# Patient Record
Sex: Male | Born: 1995
Health system: Southern US, Community
[De-identification: ages and names within clinical notes are randomized; demographics above are authoritative.]

## PROBLEM LIST (undated history)

## (undated) DIAGNOSIS — I10 Essential (primary) hypertension: Secondary | ICD-10-CM

## (undated) HISTORY — DX: Essential (primary) hypertension: I10

---
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
## 2014-01-15 ENCOUNTER — Other Ambulatory Visit: Payer: Self-pay | Admitting: Family Medicine

## 2014-03-29 ENCOUNTER — Other Ambulatory Visit: Payer: Self-pay | Admitting: Family Medicine

## 2014-06-09 ENCOUNTER — Other Ambulatory Visit: Payer: Self-pay | Admitting: Family Medicine

## 2015-03-16 ENCOUNTER — Ambulatory Visit (INDEPENDENT_AMBULATORY_CARE_PROVIDER_SITE_OTHER): Payer: BLUE CROSS/BLUE SHIELD | Admitting: Family Medicine

## 2015-03-16 ENCOUNTER — Encounter: Payer: Self-pay | Admitting: Family Medicine

## 2015-03-16 VITALS — BP 132/80 | HR 55 | Temp 97.7°F | Wt 196.0 lb

## 2015-03-16 DIAGNOSIS — I10 Essential (primary) hypertension: Secondary | ICD-10-CM

## 2015-03-16 LAB — COMPLETE METABOLIC PANEL WITH GFR
ALBUMIN: 4.2 g/dL (ref 3.6–5.1)
ALT: 22 U/L (ref 9–46)
AST: 19 U/L (ref 10–40)
Alkaline Phosphatase: 75 U/L (ref 40–115)
BUN: 16 mg/dL (ref 7–25)
CO2: 25 mmol/L (ref 20–31)
CREATININE: 0.81 mg/dL (ref 0.60–1.35)
Calcium: 9.5 mg/dL (ref 8.6–10.3)
Chloride: 106 mmol/L (ref 98–110)
Glucose, Bld: 105 mg/dL — ABNORMAL HIGH (ref 65–99)
Potassium: 4.1 mmol/L (ref 3.5–5.3)
Sodium: 141 mmol/L (ref 135–146)
Total Bilirubin: 0.5 mg/dL (ref 0.2–1.2)
Total Protein: 6.7 g/dL (ref 6.1–8.1)

## 2015-03-16 MED ORDER — ATENOLOL 50 MG PO TABS: ORAL_TABLET | ORAL | Status: DC

## 2015-03-16 NOTE — Progress Notes (Signed)
   Subjective:    Patient ID: Timothy Shea, male    DOB: May 05, 1996, 19 y.o.   MRN: [id number]  HPI Hypertension- Pt denies chest pain, SOB, dizziness, or heart palpitations.  Taking meds as directed w/o problems.  Denies medication side effects.   No swelling.    Review of Systems     Objective:   Physical Exam  Constitutional: He is oriented to person, place, and time. He appears well-developed and well-nourished.  HENT:  Head: Normocephalic and atraumatic.  Cardiovascular: Normal rate, regular rhythm and normal heart sounds.   Pulmonary/Chest: Effort normal and breath sounds normal.  Neurological: He is alert and oriented to person, place, and time.  Skin: Skin is warm and dry.  Psychiatric: He has a normal mood and affect. His behavior is normal.          Assessment & Plan:  HTN- well controlled. F/U in 6 months.  Due for CMP.

## 2015-03-19 NOTE — Progress Notes (Signed)
Quick Note:  All labs are normal. ______ 

## 2015-03-26 ENCOUNTER — Encounter: Payer: Self-pay | Admitting: *Deleted

## 2015-03-29 ENCOUNTER — Ambulatory Visit (INDEPENDENT_AMBULATORY_CARE_PROVIDER_SITE_OTHER): Payer: BLUE CROSS/BLUE SHIELD

## 2015-03-29 ENCOUNTER — Encounter: Payer: Self-pay | Admitting: Family Medicine

## 2015-03-29 ENCOUNTER — Ambulatory Visit (INDEPENDENT_AMBULATORY_CARE_PROVIDER_SITE_OTHER): Payer: BLUE CROSS/BLUE SHIELD | Admitting: Family Medicine

## 2015-03-29 VITALS — BP 132/78 | HR 55 | Ht 74.0 in | Wt 199.0 lb

## 2015-03-29 DIAGNOSIS — R0602 Shortness of breath: Secondary | ICD-10-CM | POA: Diagnosis not present

## 2015-03-29 DIAGNOSIS — R001 Bradycardia, unspecified: Secondary | ICD-10-CM

## 2015-03-29 NOTE — Progress Notes (Signed)
Subjective:    Patient ID: Timothy Shea, male    DOB: [birth year], 19 y.o.   MRN: [id number]  HPI For one month feels like can't get a full breath. No chest pain, no palpitations. No wheezing. No prior history of pulmonary disease such as asthma. He denies any prior history of allergies. No recent upper respiratory infections they think might have triggered this. He denies any GI symptoms such as nausea vomiting or diarrhea. Denies any fevers chills or sweats. He said when this first started about a month ago it was only happening about once a week but over the last 4 days it has been having multiple times a day. He denies any postnasal drip or sinus symptoms or swelling in the mouth or tongue. He denies any difficulty swallowing. He did have one day where he was lying on his back working underneath a car when he started to feel the tightness and actually started to feel lightheaded. Otherwise he denies any additional symptoms.   Review of Systems No blood in the urine or stool. No dysphagia.  BP 132/78 mmHg  Pulse 55  Ht 6\' 2"  (1.88 m)  Wt 199 lb (90.266 kg)  BMI 25.54 kg/m2  SpO2 98%  PF 590 L/min    No Known Allergies  Past Medical History  Diagnosis Date  . Essential hypertension 08/31/2012    History reviewed. No pertinent past surgical history.  Social History   Social History  . Marital Status: Single    Spouse Name: N/A  . Number of Children: N/A  . Years of Education: N/A   Occupational History  . Not on file.   Social History Main Topics  . Smoking status: Never Smoker   . Smokeless tobacco: Not on file  . Alcohol Use: Not on file  . Drug Use: Not on file  . Sexual Activity: Not on file   Other Topics Concern  . Not on file   Social History Narrative    Family History  Problem Relation Age of Onset  . Hypertension Mother   . Depression Mother   . Hypothyroidism Mother     Outpatient Encounter Prescriptions as of 03/29/2015  Medication Sig  .  atenolol (TENORMIN) 50 MG tablet TAKE 1 TABLET DAILY  . cyclobenzaprine (FLEXERIL) 10 MG tablet Take a full tab every 8-12 hours only as needed for neck pain or headache, may cause sedation.   No facility-administered encounter medications on file as of 03/29/2015.          Objective:   Physical Exam  Constitutional: He is oriented to person, place, and time. He appears well-developed and well-nourished.  HENT:  Head: Normocephalic and atraumatic.  Right Ear: External ear normal.  Left Ear: External ear normal.  Nose: Nose normal.  Mouth/Throat: Oropharynx is clear and moist.  TMs and canals are clear.   Eyes: Conjunctivae and EOM are normal. Pupils are equal, round, and reactive to light.  Neck: Neck supple. No thyromegaly present.  Cardiovascular: Normal rate and normal heart sounds.   Pulmonary/Chest: Effort normal and breath sounds normal.  Lymphadenopathy:    He has no cervical adenopathy.  Neurological: He is alert and oriented to person, place, and time.  Skin: Skin is warm and dry.  Psychiatric: He has a normal mood and affect.          Assessment & Plan:  Shortness of breath-we'll do peak flows today. Pulse ox is normal and reassuring. We'll also get chest x-ray today. No  prior history of pulmonary disease and no other symptoms consistent with a viral illness. Consider pneumothorax. Peak flows in the green zone today which is reassuring as well as a normal pulse oximetry reading. Will check CBC as well to rule out anemia.  Bradycardia-most likely secondary to his atenolol. In someone this young a would not suspect that a pulse of 55 would cause any type of shortness of breath. But if pulmonary workup is negative then consider further evaluation for cardiac workup.

## 2015-03-30 LAB — CBC WITH DIFFERENTIAL/PLATELET
Basophils Absolute: 0.1 10*3/uL (ref 0.0–0.1)
Basophils Relative: 1 % (ref 0–1)
Eosinophils Absolute: 0.2 10*3/uL (ref 0.0–0.7)
Eosinophils Relative: 3 % (ref 0–5)
HCT: 43.8 % (ref 39.0–52.0)
HEMOGLOBIN: 14.8 g/dL (ref 13.0–17.0)
LYMPHS PCT: 29 % (ref 12–46)
Lymphs Abs: 1.7 10*3/uL (ref 0.7–4.0)
MCH: 28.7 pg (ref 26.0–34.0)
MCHC: 33.8 g/dL (ref 30.0–36.0)
MCV: 85 fL (ref 78.0–100.0)
MONO ABS: 0.5 10*3/uL (ref 0.1–1.0)
MONOS PCT: 9 % (ref 3–12)
MPV: 9.7 fL (ref 8.6–12.4)
NEUTROS ABS: 3.5 10*3/uL (ref 1.7–7.7)
Neutrophils Relative %: 58 % (ref 43–77)
Platelets: 229 10*3/uL (ref 150–400)
RBC: 5.15 MIL/uL (ref 4.22–5.81)
RDW: 13.5 % (ref 11.5–15.5)
WBC: 6 10*3/uL (ref 4.0–10.5)

## 2015-09-04 ENCOUNTER — Other Ambulatory Visit: Payer: Self-pay | Admitting: Family Medicine

## 2015-12-15 ENCOUNTER — Encounter: Payer: Self-pay | Admitting: Emergency Medicine

## 2015-12-15 ENCOUNTER — Emergency Department (INDEPENDENT_AMBULATORY_CARE_PROVIDER_SITE_OTHER)
Admission: EM | Admit: 2015-12-15 | Discharge: 2015-12-15 | Disposition: A | Discharge status: Home / Self Care | Payer: BLUE CROSS/BLUE SHIELD | Source: Home / Self Care | Attending: Emergency Medicine | Admitting: Emergency Medicine

## 2015-12-15 DIAGNOSIS — I10 Essential (primary) hypertension: Secondary | ICD-10-CM | POA: Diagnosis not present

## 2015-12-15 MED ORDER — ATENOLOL 50 MG PO TABS: 50.0000 mg | ORAL_TABLET | Freq: Every day | ORAL | Status: DC

## 2015-12-15 NOTE — ED Provider Notes (Signed)
CSN: [id number]     Arrival date & time 12/15/15  1527 History   First MD Initiated Contact with Patient 12/15/15 1533     Chief Complaint  Patient presents with  . Hypertension  [facility], Saturday, 12/15/2015  HPI Patient states that he has hypertension, takes atenolol 50 mg daily for 2 years and that works well to control BP without side effects. 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 He requests 30 day refill of atenolol until he can get in to see his PCP within the next month to reevaluate BP and refill BP med.  He checked his BP at home today, was 170/92. He denies headaches, change in vision, chest pain, shortness of breath, palpitations, syncope, edema, depressed mood. Past Medical History  Diagnosis Date  . Essential hypertension 08/31/2012   History reviewed. No pertinent past surgical history. Family History  Problem Relation Age of Onset  . Hypertension Mother   . Depression Mother   . Hypothyroidism Mother    Social History  Substance Use Topics  . Smoking status: Never Smoker   . Smokeless tobacco: None  . Alcohol Use: Yes     Comment: 1 drink per week    Review of Systems  All other systems reviewed and are negative.   Allergies  Review of patient's allergies indicates no known allergies.  Home Medications   Prior to Admission medications   Medication Sig Start Date End Date Taking? Authorizing Provider  atenolol (TENORMIN) 50 MG tablet Take 1 tablet (50 mg total) by mouth daily. APPOINTMENT NEEDED FOR FURTHER REFILLS 09/04/15   Agapito Gamesatherine D Metheney, MD  atenolol (TENORMIN) 50 MG tablet Take 1 tablet (50 mg total) by mouth daily. Any further refills would need to be authorized by Dr. Linford ArnoldMetheney. 12/15/15 01/15/16  Lajean Manesavid Massey, MD   Meds Ordered and Administered this Visit  Medications - No data to display  BP 158/96 mmHg  Pulse 74  Temp(Src) 98.4 F (36.9 C) (Oral)   Resp 16  Ht 6\' 2"  (1.88 m)  Wt 197 lb 12 oz (89.699 kg)  BMI 25.38 kg/m2  SpO2 100% No data found.   Physical Exam  Constitutional: No distress.  Cardiovascular: Normal rate.   Pulmonary/Chest: Effort normal.  Abdominal: He exhibits no distension.   reviewed vital signs, BP 158/96. He is alert, cooperative, no distress ED Course  Procedures (including critical care time)  Labs Review Labs Reviewed - No data to display  Imaging Review No results found.    MDM   1. Essential hypertension    Discharge Medication List as of 12/15/2015  4:23 PM       Details   atenolol (TENORMIN) 50 MG tablet Take 1 tablet (50 mg total) by mouth daily. Any further refills would need to be authorized by Dr. Linford ArnoldMetheney., Starting 12/15/2015, Until Tue 01/15/16, Print        Precautions and other advice discussed. Over 20 minutes spent, greater than 50% of the time spent for counseling and coordination of care. Urged him to make an appointment to follow up with Dr. Linford ArnoldMetheney, his PCP within 30 days. He voiced understanding.     Lajean Manesavid Massey, MD 12/15/15 [phone]

## 2015-12-15 NOTE — ED Notes (Signed)
Patient states HTN and out of medication, in college and has been unable to make appointment with PCP

## 2016-01-18 ENCOUNTER — Encounter: Payer: Self-pay | Admitting: Family Medicine

## 2016-01-18 ENCOUNTER — Ambulatory Visit (INDEPENDENT_AMBULATORY_CARE_PROVIDER_SITE_OTHER): Payer: BLUE CROSS/BLUE SHIELD | Admitting: Family Medicine

## 2016-01-18 DIAGNOSIS — I10 Essential (primary) hypertension: Secondary | ICD-10-CM

## 2016-01-18 DIAGNOSIS — M62838 Other muscle spasm: Secondary | ICD-10-CM | POA: Insufficient documentation

## 2016-01-18 DIAGNOSIS — Z23 Encounter for immunization: Secondary | ICD-10-CM | POA: Diagnosis not present

## 2016-01-18 DIAGNOSIS — M6248 Contracture of muscle, other site: Secondary | ICD-10-CM | POA: Diagnosis not present

## 2016-01-18 DIAGNOSIS — G44209 Tension-type headache, unspecified, not intractable: Secondary | ICD-10-CM

## 2016-01-18 LAB — COMPLETE METABOLIC PANEL WITH GFR
ALT: 24 U/L (ref 9–46)
AST: 20 U/L (ref 10–40)
Albumin: 4.3 g/dL (ref 3.6–5.1)
Alkaline Phosphatase: 73 U/L (ref 40–115)
BILIRUBIN TOTAL: 0.6 mg/dL (ref 0.2–1.2)
BUN: 11 mg/dL (ref 7–25)
CALCIUM: 9.4 mg/dL (ref 8.6–10.3)
CHLORIDE: 104 mmol/L (ref 98–110)
CO2: 27 mmol/L (ref 20–31)
Creat: 0.88 mg/dL (ref 0.60–1.35)
Glucose, Bld: 82 mg/dL (ref 65–99)
Potassium: 4.2 mmol/L (ref 3.5–5.3)
Sodium: 140 mmol/L (ref 135–146)
TOTAL PROTEIN: 6.9 g/dL (ref 6.1–8.1)

## 2016-01-18 LAB — LIPID PANEL
CHOL/HDL RATIO: 4.7 ratio (ref <=5.0)
Cholesterol: 194 mg/dL (ref 125–200)
HDL: 41 mg/dL (ref >=40)
LDL Cholesterol: 105 mg/dL (ref <130)
Triglycerides: 240 mg/dL — ABNORMAL HIGH (ref <150)
VLDL: 48 mg/dL — ABNORMAL HIGH (ref <30)

## 2016-01-18 MED ORDER — CYCLOBENZAPRINE HCL 10 MG PO TABS: ORAL_TABLET | ORAL | Status: DC

## 2016-01-18 MED ORDER — ATENOLOL 50 MG PO TABS: 50.0000 mg | ORAL_TABLET | Freq: Every day | ORAL | Status: DC

## 2016-01-18 NOTE — Progress Notes (Signed)
   Subjective:    Patient ID: Timothy Shea, male    DOB: 02/25/1996, 20 y.o.   MRN: [id number]  HPI Hypertension- Pt denies chest pain, SOB, dizziness, or heart palpitations.  Taking meds as directed w/o problems.  Denies medication side effects.  No swelling. No side effects.  Did also like a refill on the muscle relaxer that we had given him previously for neck pain and headaches. He doesn't use it often but it does seem to work well. Refill provided.  Review of Systems     Objective:   Physical Exam  Constitutional: He is oriented to person, place, and time. He appears well-developed and well-nourished.  HENT:  Head: Normocephalic and atraumatic.  Cardiovascular: Normal rate, regular rhythm and normal heart sounds.   Pulmonary/Chest: Effort normal and breath sounds normal.  Neurological: He is alert and oriented to person, place, and time.  Skin: Skin is warm and dry.  Psychiatric: He has a normal mood and affect. His behavior is normal.          Assessment & Plan:  HTN - well controlled. Continue current regimen. Refills provided. Did strongly encourage and follow-up in 6 months. Explained that for hypertension this is a condition that we do follow twice a year. He is also due for updated blood work that lab slip was provided today.  Neck pain/headaches-refilled Flexeril for 20 tabs today.  Tdap given today.

## 2016-12-02 ENCOUNTER — Encounter: Payer: Self-pay | Admitting: Family Medicine

## 2016-12-02 ENCOUNTER — Ambulatory Visit (INDEPENDENT_AMBULATORY_CARE_PROVIDER_SITE_OTHER): Payer: BLUE CROSS/BLUE SHIELD | Admitting: Family Medicine

## 2016-12-02 VITALS — BP 132/84 | HR 66 | Ht 74.0 in | Wt 211.0 lb

## 2016-12-02 DIAGNOSIS — F329 Major depressive disorder, single episode, unspecified: Secondary | ICD-10-CM | POA: Diagnosis not present

## 2016-12-02 DIAGNOSIS — G44209 Tension-type headache, unspecified, not intractable: Secondary | ICD-10-CM | POA: Diagnosis not present

## 2016-12-02 DIAGNOSIS — I10 Essential (primary) hypertension: Secondary | ICD-10-CM

## 2016-12-02 MED ORDER — ATENOLOL 50 MG PO TABS: 50.0000 mg | ORAL_TABLET | Freq: Every day | ORAL | 2 refills | Status: DC

## 2016-12-02 MED ORDER — CYCLOBENZAPRINE HCL 10 MG PO TABS: ORAL_TABLET | ORAL | 0 refills | Status: DC

## 2016-12-02 NOTE — Progress Notes (Addendum)
Subjective:    CC: HTN  HPI:  Hypertension- Pt denies chest pain, SOB, dizziness, or heart palpitations.  Taking meds as directed w/o problems.  Denies medication side effects. Does check his blood pressure at home and reports getting 130s over 70s at home when he is on his medication.   He also reports that he started not doing well in school. He was recently diagnosed with major depressive disorder and is currently being treated with medication through his school. He can remove the name of the medication but says he will try to get the name of it to me. His only taking 1 class this semester.  Past medical history, Surgical history, Family history not pertinant except as noted below, Social history, Allergies, and medications have been entered into the medical record, reviewed, and corrections made.   Review of Systems: No fevers, chills, night sweats, weight loss, chest pain, or shortness of breath.   Objective:    General: Well Developed, well nourished, and in no acute distress.  Neuro: Alert and oriented x3, extra-ocular muscles intact, sensation grossly intact.  HEENT: Normocephalic, atraumatic  Skin: Warm and dry, no rashes. Cardiac: Regular rate and rhythm, no murmurs rubs or gallops, no lower extremity edema.  Respiratory: Clear to auscultation bilaterally. Not using accessory muscles, speaking in full sentences.   Impression and Recommendations:    HTN - Uncontrolled, that he did not take his medication today he ran out. We'll go ahead and refill today and asked him to come back for nurse visit in 1-2 weeks back on the medication.  Major depressive disorder-currently under management with psychiatry through his school.  PHQ - 9 score of 12 today.

## 2016-12-02 NOTE — Addendum Note (Signed)
Addended by: Deno Etienne on: 12/02/2016 11:37 AM   Modules accepted: Orders

## 2016-12-08 ENCOUNTER — Ambulatory Visit: Payer: BLUE CROSS/BLUE SHIELD

## 2016-12-26 ENCOUNTER — Ambulatory Visit: Payer: BLUE CROSS/BLUE SHIELD

## 2017-03-16 ENCOUNTER — Encounter: Payer: Self-pay | Admitting: Osteopathic Medicine

## 2017-03-16 ENCOUNTER — Ambulatory Visit (INDEPENDENT_AMBULATORY_CARE_PROVIDER_SITE_OTHER): Payer: BLUE CROSS/BLUE SHIELD | Admitting: Osteopathic Medicine

## 2017-03-16 VITALS — BP 111/72 | HR 60 | Resp 18 | Wt 226.2 lb

## 2017-03-16 DIAGNOSIS — I10 Essential (primary) hypertension: Secondary | ICD-10-CM | POA: Diagnosis not present

## 2017-03-16 DIAGNOSIS — F329 Major depressive disorder, single episode, unspecified: Secondary | ICD-10-CM

## 2017-03-16 MED ORDER — ATENOLOL 50 MG PO TABS: 50.0000 mg | ORAL_TABLET | Freq: Every day | ORAL | 1 refills | Status: DC

## 2017-03-16 NOTE — Progress Notes (Signed)
HPI: Timothy Shea is a 21 y.o. male  who presents to [facility] today, 03/16/17,  for chief complaint of:  Chief Complaint  Patient presents with  . Hypertension  . Depression    Last seen by PCP 12/02/2016, notes reviewed. Following up at that time for hypertension. 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 At that time, patient was unable to remember the name of the medication he was taking. On review of records from external pharmacy, looks like he is on venlafaxine ER 37.5 mg last dispensed 02/19/2017.  Stop Venlaxafine 6 days ago due to concerns that this was increasing his blood pressure. He is here today to get his blood pressure rechecked and refill on his atenolol.  He has an appointment with his psychiatrist in 3 days. He is not interested in changing or starting new medications at this point. He has experienced no concerning withdrawal side effects after stopping venlaxafine.   Past medical history, surgical history, social history and family history reviewed.  Patient Active Problem List   Diagnosis Date Noted  . Neck muscle spasm 01/18/2016  . Essential hypertension 08/31/2012  . HYPERTRIGLYCERIDEMIA 08/06/2010  . DYSHIDROTIC ECZEMA, HANDS 04/27/2007    Current medication list and allergy/intolerance information reviewed.   Current Outpatient Prescriptions on File Prior to Visit  Medication Sig Dispense Refill  . atenolol (TENORMIN) 50 MG tablet Take 1 tablet (50 mg total) by mouth daily. 90 tablet 2  . cyclobenzaprine (FLEXERIL) 10 MG tablet Take a full tab every 8-12 hours only as needed for neck pain or headache, may cause sedation. 45 tablet 0   No current facility-administered medications on file prior to visit.    No Known Allergies    Review of Systems:  Constitutional: No recent illness  HEENT: No  headache, no vision change  Cardiac: No  chest  pain, No  pressure, No palpitations  Respiratory:  No  shortness of breath. No  Cough  Neurologic: No  weakness, No  Dizziness  Psychiatric: No  concerns with depression, No  concerns with anxiety - GAD7/PHQ9 as below   Exam:  BP 111/72   Pulse 60   Resp 18   Wt 226 lb 3.2 oz (102.6 kg)   BMI 29.04 kg/m   Constitutional: VS see above. General Appearance: alert, well-developed, well-nourished, NAD  Eyes: Normal lids and conjunctive, non-icteric sclera  Ears, Nose, Mouth, Throat: MMM, Normal external inspection ears/nares/mouth/lips/gums.  Neck: No masses, trachea midline.   Respiratory: Normal respiratory effort. no wheeze, no rhonchi, no rales  Cardiovascular: S1/S2 normal, no murmur, no rub/gallop auscultated. RRR.   Musculoskeletal: Gait normal. Symmetric and independent movement of all extremities  Neurological: Normal balance/coordination. No tremor.  Skin: warm, dry, intact.   Psychiatric: Normal judgment/insight. Normal mood and affect. Oriented x3.    No results found for this or any previous visit (from the past 2160 hour(s)).  No results found.  GAD 7 : Generalized Anxiety Score 03/16/2017  Nervous, Anxious, on Edge 1  Control/stop worrying 0  Worry too much - different things 0  Trouble relaxing 2  Restless 0  Easily annoyed or irritable 0  Afraid - awful might happen 0  Total GAD 7 Score 3    Depression screen [facility] 03/16/2017 12/02/2016  Decreased Interest 1 3  Down, Depressed, Hopeless 1 1  PHQ - 2 Score 2 4  Altered sleeping 2 3  Tired, decreased energy  3 2  Change in appetite 0 1  Feeling bad or failure about yourself  0 2  Trouble concentrating 0 0  Moving slowly or fidgety/restless 0 0  Suicidal thoughts 0 0  PHQ-9 Score 7 12      ASSESSMENT/PLAN:   Essential hypertension - Refilled medication, blood pressure looks fine  Single current episode of major depressive disorder, unspecified depression episode severity - Defer management  to psychiatry, RTC/ER precautions reviewed with patient in case of relapse or venlaxafine withdrawal symptoms   Follow-up plan: Return in about 6 months (around 09/16/2017) for recheck blood pressure and routine check-up with Dr Linford Arnold, sooner if needed .  Visit summary with medication list and pertinent instructions was printed for patient to review, alert [place] if any changes needed. All questions at time of visit were answered - patient instructed to contact office with any additional concerns. ER/RTC precautions were reviewed with the patient and understanding verbalized.   Note: Total time spent 15 minutes, greater than 50% of the visit was spent face-to-face counseling and coordinating care for the following: The primary encounter diagnosis was Essential hypertension. A diagnosis of Single current episode of major depressive disorder, unspecified depression episode severity was also pertinent to this visit.Marland Kitchen

## 2017-08-31 ENCOUNTER — Encounter: Payer: Self-pay | Admitting: Family Medicine

## 2017-08-31 ENCOUNTER — Ambulatory Visit (INDEPENDENT_AMBULATORY_CARE_PROVIDER_SITE_OTHER): Payer: BLUE CROSS/BLUE SHIELD | Admitting: Family Medicine

## 2017-08-31 VITALS — BP 120/54 | HR 54 | Ht 74.0 in | Wt 226.0 lb

## 2017-08-31 DIAGNOSIS — I10 Essential (primary) hypertension: Secondary | ICD-10-CM | POA: Diagnosis not present

## 2017-08-31 DIAGNOSIS — R200 Anesthesia of skin: Secondary | ICD-10-CM | POA: Diagnosis not present

## 2017-08-31 MED ORDER — ATENOLOL 50 MG PO TABS: 50.0000 mg | ORAL_TABLET | Freq: Every day | ORAL | 1 refills | Status: DC

## 2017-08-31 NOTE — Progress Notes (Signed)
   Subjective:    Patient ID: Timothy Shea, male    DOB: 10/16/1995, 22 y.o.   MRN: [id number]  HPI Hypertension- Pt denies chest pain, SOB, dizziness, or heart palpitations.  Taking meds as directed w/o problems.  Denies medication side effects.     He also complains of intermittent bilateral hand and numbness that typically occurs at night.  He says several weeks before her break he started noticing he was waking up with his hands and arms feeling numb.  He had been mountain biking a lot right around that time.  Then we had a lot of rain and he was not able to get out and bike for several weeks and it actually gradually got better.  Now it is happening maybe once a week.  He denies any neck pain or injuries.  He is not taking any medication for it.  He does not have any special diet such as being vegetarian that could cause B12 deficiency etc.  Review of Systems     Objective:   Physical Exam  Constitutional: He is oriented to person, place, and time. He appears well-developed and well-nourished.  HENT:  Head: Normocephalic and atraumatic.  Cardiovascular: Normal rate, regular rhythm and normal heart sounds.  Pulmonary/Chest: Effort normal and breath sounds normal.  Neurological: He is alert and oriented to person, place, and time.  Skin: Skin is warm and dry.  Psychiatric: He has a normal mood and affect. His behavior is normal.          Assessment & Plan:  HTN - well controlled.  Continue current regimen.  No labs needed at this point in time.  Follow-up in 6 months.  Medication refill sent to pharmacy.  Bilateral hand numbness-most likely carpal tunnel syndrome though could be from neck impingement.  At this point it seems to be getting better and he does not want any additional workup at this time.  If it recurs or becomes more persistent then recommend evaluation with labs including CBC, B12 TSH and a CMP.  Also consider nerve conduction studies if not improving as well.  Or  even a trial of cockup splint.  He will let me know if it becomes more of a problem.

## 2017-09-14 ENCOUNTER — Ambulatory Visit: Payer: BLUE CROSS/BLUE SHIELD | Admitting: Family Medicine

## 2018-03-29 ENCOUNTER — Other Ambulatory Visit: Payer: Self-pay | Admitting: Family Medicine

## 2018-05-02 ENCOUNTER — Other Ambulatory Visit: Payer: Self-pay | Admitting: Family Medicine

## 2018-06-01 ENCOUNTER — Other Ambulatory Visit: Payer: Self-pay | Admitting: Family Medicine

## 2018-07-07 ENCOUNTER — Encounter: Payer: Self-pay | Admitting: Family Medicine

## 2018-07-07 ENCOUNTER — Ambulatory Visit (INDEPENDENT_AMBULATORY_CARE_PROVIDER_SITE_OTHER): Payer: BLUE CROSS/BLUE SHIELD | Admitting: Family Medicine

## 2018-07-07 VITALS — BP 127/53 | HR 56 | Ht 72.05 in | Wt 211.0 lb

## 2018-07-07 DIAGNOSIS — I10 Essential (primary) hypertension: Secondary | ICD-10-CM | POA: Diagnosis not present

## 2018-07-07 DIAGNOSIS — Z0000 Encounter for general adult medical examination without abnormal findings: Secondary | ICD-10-CM | POA: Diagnosis not present

## 2018-07-07 MED ORDER — ATENOLOL 50 MG PO TABS: ORAL_TABLET | ORAL | 1 refills | Status: DC

## 2018-07-07 NOTE — Progress Notes (Signed)
Established Patient Office Visit - CPE  Subjective:  Patient ID: Timothy Shea, male    DOB: [birth year]  Age: 22 y.o. MRN: [id number]  CC:  Chief Complaint  Patient presents with  . 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  He is hoping something will become available in the next 6 months.  Past Medical History:  Diagnosis Date  . Essential hypertension 08/31/2012    History reviewed. No pertinent surgical history.  Family History  Problem Relation Age of Onset  . Hypertension Mother   . Depression Mother   . Hypothyroidism Mother     Social History   Socioeconomic History  . Marital status: Single    Spouse name: Not on file  . Number of children: Not on file  . Years of education: Not on file  . Highest education level: Not on file  Occupational History  . Not on file  Social Needs  . Financial resource strain: Not on file  . Food insecurity:    Worry: Not on file    Inability: Not on file  . Transportation needs:    Medical: Not on file    Non-medical: Not on file  Tobacco Use  . Smoking status: Never Smoker  . Smokeless tobacco: Never Used  Substance and Sexual Activity  . Alcohol use: Yes    Comment: 1 drink per week  . Drug use: No  . Sexual activity: Not on file  Lifestyle  . Physical activity:    Days per week: Not on file    Minutes per session: Not on file  . Stress: Not on file  Relationships  . Social connections:    Talks on phone: Not on file    Gets together: Not on file    Attends religious service: Not on file    Active member of club or organization: Not on file    Attends meetings of clubs or organizations: Not on file    Relationship status: Not on file  . Intimate partner violence:    Fear of current or ex partner: Not on file    Emotionally abused: Not on file    Physically abused: Not on file    Forced sexual activity: Not on file   Other Topics Concern  . Not on file  Social History Narrative  . Not on file    Outpatient Medications Prior to Visit  Medication Sig Dispense Refill  . atenolol (TENORMIN) 50 MG tablet TAKE 1 TABLET BY MOUTH EVERY DAY NEEDS APPOINTMENT 15 tablet 0  . cyclobenzaprine (FLEXERIL) 10 MG tablet Take a full tab every 8-12 hours only as needed for neck pain or headache, may cause sedation. 45 tablet 0   No facility-administered medications prior to visit.     No Known Allergies  ROS Review of Systems    Objective:    Physical Exam  BP (!) 127/53   Pulse (!) 56   Ht 6' 0.05" (1.83 m)   Wt 211 lb (95.7 kg)   SpO2 100%   BMI 28.58 kg/m  Wt Readings from Last 3 Encounters:  07/07/18 211 lb (95.7 kg)  08/31/17 226 lb (102.5 kg)  03/16/17 226 lb 3.2 oz (102.6 kg)     Health Maintenance Due  Topic Date Due  . HIV Screening  10/12/2009    There are no preventive care reminders to display for this patient.  Lab Results  Component Value Date   TSH 2.384 01/19/2012   Lab Results  Component Value Date   WBC 6.0 03/29/2015   HGB 14.8 03/29/2015   HCT 43.8 03/29/2015   MCV 85.0 03/29/2015   PLT 229 03/29/2015   Lab Results  Component Value Date   NA 140 01/18/2016   K 4.2 01/18/2016   CO2 27 01/18/2016   GLUCOSE 82 01/18/2016   BUN 11 01/18/2016   CREATININE 0.88 01/18/2016   BILITOT 0.6 01/18/2016   ALKPHOS 73 01/18/2016   AST 20 01/18/2016   ALT 24 01/18/2016   PROT 6.9 01/18/2016   ALBUMIN 4.3 01/18/2016   CALCIUM 9.4 01/18/2016   Lab Results  Component Value Date   CHOL 194 01/18/2016   Lab Results  Component Value Date   HDL 41 01/18/2016   Lab Results  Component Value Date   LDLCALC 105 01/18/2016   Lab Results  Component Value Date   TRIG 240 (H) 01/18/2016   Lab Results  Component Value Date   CHOLHDL 4.7 01/18/2016   No results found for: HGBA1C    Assessment & Plan:   Problem List Items Addressed This Visit       Cardiovascular and Mediastinum   Essential hypertension    Well-controlled.  He is happy with his current regimen and feels well on it even though his pulse is a little bradycardic he denies feeling tired or sluggish.      Relevant Medications   atenolol (TENORMIN) 50 MG tablet   Other Relevant Orders   Lipid panel   COMPLETE METABOLIC PANEL WITH GFR   TSH    Other Visit Diagnoses    Routine general medical examination at a health care facility    -  Primary   Relevant Orders   Lipid panel   COMPLETE METABOLIC PANEL WITH GFR   TSH      Meds ordered this encounter  Medications  . atenolol (TENORMIN) 50 MG tablet    Sig: TAKE 1 TABLET BY MOUTH EVERY DAY    Dispense:  90 tablet    Refill:  1    Follow-up: Return in about 1 year (around 07/08/2019) for follow up for BP .    Nani Gasseratherine Karson Chicas, MD

## 2018-07-07 NOTE — Assessment & Plan Note (Signed)
Well-controlled.  He is happy with his current regimen and feels well on it even though his pulse is a little bradycardic he denies feeling tired or sluggish.

## 2018-07-07 NOTE — Patient Instructions (Signed)

## 2018-07-08 LAB — COMPLETE METABOLIC PANEL WITH GFR
AG RATIO: 1.5 (calc) (ref 1.0–2.5)
ALT: 22 U/L (ref 9–46)
AST: 20 U/L (ref 10–40)
Albumin: 4.3 g/dL (ref 3.6–5.1)
Alkaline phosphatase (APISO): 87 U/L (ref 40–115)
BUN: 12 mg/dL (ref 7–25)
CALCIUM: 9.5 mg/dL (ref 8.6–10.3)
CO2: 26 mmol/L (ref 20–32)
CREATININE: 0.99 mg/dL (ref 0.60–1.35)
Chloride: 104 mmol/L (ref 98–110)
GFR, EST AFRICAN AMERICAN: 124 mL/min/{1.73_m2} (ref >=60)
GFR, EST NON AFRICAN AMERICAN: 107 mL/min/{1.73_m2} (ref >=60)
Globulin: 2.8 g/dL (calc) (ref 1.9–3.7)
Glucose, Bld: 96 mg/dL (ref 65–99)
POTASSIUM: 4.5 mmol/L (ref 3.5–5.3)
Sodium: 140 mmol/L (ref 135–146)
TOTAL PROTEIN: 7.1 g/dL (ref 6.1–8.1)
Total Bilirubin: 0.8 mg/dL (ref 0.2–1.2)

## 2018-07-08 LAB — LIPID PANEL
Cholesterol: 250 mg/dL — ABNORMAL HIGH (ref <200)
HDL: 38 mg/dL — ABNORMAL LOW (ref >40)
LDL Cholesterol (Calc): 171 mg/dL (calc) — ABNORMAL HIGH
NON-HDL CHOLESTEROL (CALC): 212 mg/dL — AB (ref <130)
TRIGLYCERIDES: 243 mg/dL — AB (ref <150)
Total CHOL/HDL Ratio: 6.6 (calc) — ABNORMAL HIGH (ref <5.0)

## 2018-07-08 LAB — TSH: TSH: 1.41 mIU/L (ref 0.40–4.50)

## 2018-07-27 DIAGNOSIS — H6092 Unspecified otitis externa, left ear: Secondary | ICD-10-CM | POA: Diagnosis not present

## 2018-07-27 DIAGNOSIS — H6692 Otitis media, unspecified, left ear: Secondary | ICD-10-CM | POA: Diagnosis not present

## 2018-09-21 ENCOUNTER — Other Ambulatory Visit: Payer: Self-pay

## 2018-09-21 ENCOUNTER — Emergency Department (INDEPENDENT_AMBULATORY_CARE_PROVIDER_SITE_OTHER)
Admission: EM | Admit: 2018-09-21 | Discharge: 2018-09-21 | Disposition: A | Discharge status: Home / Self Care | Payer: BLUE CROSS/BLUE SHIELD | Source: Home / Self Care | Attending: Family Medicine | Admitting: Family Medicine

## 2018-09-21 ENCOUNTER — Encounter: Payer: Self-pay | Admitting: *Deleted

## 2018-09-21 DIAGNOSIS — S61210A Laceration without foreign body of right index finger without damage to nail, initial encounter: Secondary | ICD-10-CM

## 2018-09-21 NOTE — Discharge Instructions (Signed)
°  Try to keep the bandage on for at least 48 hours.  You may then gently remove the thick bandage and replace with a normal bandage until the wound is healed and the steri-strips have fallen off, about 1 week.   Please follow up with family medicine or return to urgent care if concern for infection- increased pain, drainage of pus, redness.

## 2018-09-21 NOTE — ED Triage Notes (Signed)
Pt c/o 2nd finger laceration x 1530 today while using a knife. Tdap 2017.

## 2018-09-21 NOTE — ED Provider Notes (Signed)
[facility]    CSN: [id number] Arrival date & time: 09/21/18  1653     History   Chief Complaint Chief Complaint  Patient presents with  . Extremity Laceration    HPI Timothy Shea is a 23 y.o. male.   HPI Timothy Shea is a 23 y.o. male presenting to UC with c/o laceration to Right index finger around 1530 this afternoon, cutting it using a knife cleaning out a 3D printer.  Bleeding controlled PTA. Pain is minimal.  Last Tdap 2017.   Past Medical History:  Diagnosis Date  . Essential hypertension 08/31/2012    Patient Active Problem List   Diagnosis Date Noted  . Neck muscle spasm 01/18/2016  . Essential hypertension 08/31/2012  . HYPERTRIGLYCERIDEMIA 08/06/2010  . DYSHIDROTIC ECZEMA, HANDS 04/27/2007    History reviewed. No pertinent surgical history.     Home Medications    Prior to Admission medications   Medication Sig Start Date End Date Taking? Authorizing Provider  atenolol (TENORMIN) 50 MG tablet TAKE 1 TABLET BY MOUTH EVERY DAY 07/07/18   Agapito Games, MD    Family History Family History  Problem Relation Age of Onset  . Hypertension Mother   . Depression Mother   . Hypothyroidism Mother     Social History Social History   Tobacco Use  . Smoking status: Never Smoker  . Smokeless tobacco: Never Used  Substance Use Topics  . Alcohol use: Yes    Comment: 1 drink per week  . Drug use: No     Allergies   Patient has no known allergies.   Review of Systems Review of Systems  Skin: Positive for wound. Negative for color change.  Neurological: Negative for weakness and numbness.     Physical Exam Triage Vital Signs ED Triage Vitals  Enc Vitals Group     BP 09/21/18 1718 124/81     Pulse Rate 09/21/18 1718 60     Resp 09/21/18 1718 18     Temp 09/21/18 1718 98.6 F (37 C)     Temp Source 09/21/18 1718 Oral     SpO2 09/21/18 1718 100 %     Weight --      Height --      Head Circumference --    Peak Flow --      Pain Score 09/21/18 1719 0     Pain Loc --      Pain Edu? --      Excl. in GC? --    No data found.  Updated Vital Signs BP 124/81 (BP Location: Right Arm)   Pulse 60   Temp 98.6 F (37 C) (Oral)   Resp 18   SpO2 100%   Visual Acuity Right Eye Distance:   Left Eye Distance:   Bilateral Distance:    Right Eye Near:   Left Eye Near:    Bilateral Near:     Physical Exam Vitals signs and nursing note reviewed.  Constitutional:      Appearance: Normal appearance. He is well-developed.  HENT:     Head: Normocephalic and atraumatic.  Neck:     Musculoskeletal: Normal range of motion.  Cardiovascular:     Rate and Rhythm: Normal rate.  Pulmonary:     Effort: Pulmonary effort is normal.  Musculoskeletal: Normal range of motion.        General: No tenderness.     Comments: Right index finger: full ROM, no edema.  Skin:    General: Skin  is warm and dry.     Capillary Refill: Capillary refill takes less than 2 seconds.     Comments: Right index finger: superficial 1cm laceration dorsal aspect distal phalanx.   Neurological:     Mental Status: He is alert and oriented to person, place, and time.  Psychiatric:        Behavior: Behavior normal.      UC Treatments / Results  Labs (all labs ordered are listed, but only abnormal results are displayed) Labs Reviewed - No data to display  EKG None  Radiology No results found.  Procedures Laceration Repair Date/Time: 09/22/2018 8:56 PM Performed by: Lurene Shadow, PA-C Authorized by: Lattie Haw, MD   Consent:    Consent obtained:  Verbal   Consent given by:  Patient   Risks discussed:  Infection, pain and poor wound healing   Alternatives discussed:  No treatment and delayed treatment (sutures vs steri-strips & splint) Anesthesia (see MAR for exact dosages):    Anesthesia method:  None Laceration details:    Location:  Finger   Finger location:  R index finger   Length (cm):  1    Depth (mm):  2 Repair type:    Repair type:  Simple Exploration:    Hemostasis achieved with:  Direct pressure   Wound exploration: wound explored through full range of motion and entire depth of wound probed and visualized     Wound extent: no areolar tissue violation noted, no fascia violation noted, no foreign bodies/material noted, no muscle damage noted, no nerve damage noted, no tendon damage noted, no underlying fracture noted and no vascular damage noted     Contaminated: no   Treatment:    Area cleansed with:  Saline   Amount of cleaning:  Standard Skin repair:    Repair method:  Steri-Strips   Number of Steri-Strips:  3 Approximation:    Approximation:  Close Post-procedure details:    Dressing:  Bulky dressing   Patient tolerance of procedure:  Tolerated well, no immediate complications   (including critical care time)  Medications Ordered in UC Medications - No data to display  Initial Impression / Assessment and Plan / UC Course  I have reviewed the triage vital signs and the nursing notes.  Pertinent labs & imaging results that were available during my care of the patient were reviewed by me and considered in my medical decision making (see chart for details).    Laceration dressed as noted above AVS provided  Final Clinical Impressions(s) / UC Diagnoses   Final diagnoses:  Laceration without foreign body of right index finger without damage to nail, initial encounter     Discharge Instructions      Try to keep the bandage on for at least 48 hours.  You may then gently remove the thick bandage and replace with a normal bandage until the wound is healed and the steri-strips have fallen off, about 1 week.   Please follow up with family medicine or return to urgent care if concern for infection- increased pain, drainage of pus, redness.     ED Prescriptions    None     Controlled Substance Prescriptions [place] Controlled Substance Registry consulted? Not  Applicable   Rolla Plate 09/22/18 2057

## 2018-11-24 ENCOUNTER — Encounter: Payer: Self-pay | Admitting: Family Medicine

## 2018-11-24 ENCOUNTER — Ambulatory Visit (INDEPENDENT_AMBULATORY_CARE_PROVIDER_SITE_OTHER): Payer: BLUE CROSS/BLUE SHIELD | Admitting: Family Medicine

## 2018-11-24 VITALS — BP 167/83 | HR 66 | Ht 72.05 in | Wt 203.8 lb

## 2018-11-24 DIAGNOSIS — E785 Hyperlipidemia, unspecified: Secondary | ICD-10-CM | POA: Diagnosis not present

## 2018-11-24 DIAGNOSIS — I10 Essential (primary) hypertension: Secondary | ICD-10-CM | POA: Diagnosis not present

## 2018-11-24 MED ORDER — ATENOLOL 50 MG PO TABS: ORAL_TABLET | ORAL | 2 refills | Status: DC

## 2018-11-24 NOTE — Progress Notes (Signed)
Virtual Visit via Video Note  I connected with Timothy Shea on 11/24/18 at 10:10 AM EDT by a video enabled telemedicine application and verified that I am speaking with the correct person using two identifiers.   I discussed the limitations of evaluation and management by telemedicine and the availability of in person appointments. The patient expressed understanding and agreed to proceed.  Subjective:    CC: BP check   HPI: Hypertension- Pt denies chest pain, SOB, dizziness, or heart palpitations.  Taking meds as directed w/o problems.  Denies medication side effects.    FU hyperlipoidemia - labs in the fall reveal high LDL.  Lab Results  Component Value Date   CHOL 250 (H) 07/07/2018   HDL 38 (L) 07/07/2018   LDLCALC 171 (H) 07/07/2018   TRIG 243 (H) 07/07/2018   CHOLHDL 6.6 (H) 07/07/2018      Past medical history, Surgical history, Family history not pertinant except as noted below, Social history, Allergies, and medications have been entered into the medical record, reviewed, and corrections made.   Review of Systems: No fevers, chills, night sweats, weight loss, chest pain, or shortness of breath.   Objective:    General: Speaking clearly in complete sentences without any shortness of breath.  Alert and oriented x3.  Normal judgment. No apparent acute distress.   Impression and Recommendations:    HTN - Well controlled. Continue current regimen. Follow up in  6 months.    Hyperlipidemia -  due for repeat lagbs in the fall.  Just encouraged him to continue to work on healthy diet.  He is very active and exercises regularly.   Meds ordered this encounter  Medications  . atenolol (TENORMIN) 50 MG tablet    Sig: TAKE 1 TABLET BY MOUTH EVERY DAY    Dispense:  90 tablet    Refill:  2    I discussed the assessment and treatment plan with the patient. The patient was provided an opportunity to ask questions and all were answered. The patient agreed with the plan  and demonstrated an understanding of the instructions.   The patient was advised to call back or seek an in-person evaluation if the symptoms worsen or if the condition fails to improve as anticipated.   Nani Gasser, MD

## 2019-08-29 ENCOUNTER — Other Ambulatory Visit: Payer: Self-pay | Admitting: Family Medicine

## 2019-08-29 DIAGNOSIS — I10 Essential (primary) hypertension: Secondary | ICD-10-CM

## 2019-12-08 ENCOUNTER — Encounter: Payer: Self-pay | Admitting: Family Medicine

## 2019-12-08 ENCOUNTER — Other Ambulatory Visit: Payer: Self-pay

## 2019-12-08 ENCOUNTER — Ambulatory Visit (INDEPENDENT_AMBULATORY_CARE_PROVIDER_SITE_OTHER): Payer: BC Managed Care – PPO | Admitting: Family Medicine

## 2019-12-08 VITALS — BP 126/57 | HR 54 | Ht 72.0 in | Wt 181.0 lb

## 2019-12-08 DIAGNOSIS — E782 Mixed hyperlipidemia: Secondary | ICD-10-CM

## 2019-12-08 DIAGNOSIS — E781 Pure hyperglyceridemia: Secondary | ICD-10-CM | POA: Diagnosis not present

## 2019-12-08 DIAGNOSIS — I10 Essential (primary) hypertension: Secondary | ICD-10-CM

## 2019-12-08 DIAGNOSIS — E785 Hyperlipidemia, unspecified: Secondary | ICD-10-CM | POA: Diagnosis not present

## 2019-12-08 LAB — COMPLETE METABOLIC PANEL WITH GFR
AG Ratio: 2.2 (calc) (ref 1.0–2.5)
ALT: 18 U/L (ref 9–46)
AST: 17 U/L (ref 10–40)
Albumin: 4.7 g/dL (ref 3.6–5.1)
Alkaline phosphatase (APISO): 77 U/L (ref 36–130)
BUN: 12 mg/dL (ref 7–25)
CO2: 30 mmol/L (ref 20–32)
Calcium: 9.7 mg/dL (ref 8.6–10.3)
Chloride: 104 mmol/L (ref 98–110)
Creat: 0.96 mg/dL (ref 0.60–1.35)
GFR, Est African American: 127 mL/min/{1.73_m2} (ref >=60)
GFR, Est Non African American: 109 mL/min/{1.73_m2} (ref >=60)
Globulin: 2.1 g/dL (calc) (ref 1.9–3.7)
Glucose, Bld: 108 mg/dL — ABNORMAL HIGH (ref 65–99)
Potassium: 4.2 mmol/L (ref 3.5–5.3)
Sodium: 140 mmol/L (ref 135–146)
Total Bilirubin: 0.5 mg/dL (ref 0.2–1.2)
Total Protein: 6.8 g/dL (ref 6.1–8.1)

## 2019-12-08 LAB — LIPID PANEL W/REFLEX DIRECT LDL
Cholesterol: 182 mg/dL (ref <200)
HDL: 42 mg/dL (ref >=40)
LDL Cholesterol (Calc): 116 mg/dL (calc) — ABNORMAL HIGH
Non-HDL Cholesterol (Calc): 140 mg/dL (calc) — ABNORMAL HIGH (ref <130)
Total CHOL/HDL Ratio: 4.3 (calc) (ref <5.0)
Triglycerides: 129 mg/dL (ref <150)

## 2019-12-08 MED ORDER — LISINOPRIL 20 MG PO TABS
20.0000 mg | ORAL_TABLET | Freq: Every day | ORAL | 0 refills | Status: DC
Start: 2019-12-08 — End: 2019-12-14

## 2019-12-08 NOTE — Progress Notes (Signed)
Pt brought in his home BP cuff the reading here today was 134/84 P: 54

## 2019-12-08 NOTE — Patient Instructions (Signed)
Okay to discontinue the atenolol and switch to lisinopril 20 mg.  It can take 2 weeks to reach full efficacy on the medication.  At that point in time if you are still seeing blood pressures in the 130s or higher then please give [place] a call and I will adjust her dose.  Have any problems or side effects on the medication and give [place] a call back sooner.

## 2019-12-08 NOTE — Assessment & Plan Note (Signed)
Blood pressures are fair but not optimal we discussed that we really want a get a systolic pressure in the 120s if at all possible.  Because of some issues with not being to reach max heart rate during exercise I would like to take him off of beta-blocker and switch him to an ACE inhibitor.  We will start with lisinopril 20 mg one by potential side effects.

## 2019-12-08 NOTE — Assessment & Plan Note (Signed)
Elevated lipid levels.  Due to recheck those today.  He has lost a significant amount of weight so I am hoping that the lipids are much better.

## 2019-12-08 NOTE — Progress Notes (Signed)
Established Patient Office Visit  Subjective:  Patient ID: Timothy Shea, male    DOB: 09/21/1995  Age: 24 y.o. MRN: [id number]  CC:  Chief Complaint  Patient presents with  . Hypertension    HPI Timothy Shea presents for hypertension.  He does have a home blood pressure cuff and brought in his readings from home.  Most of the blood pressures are in the 130s but a few in the 150 range and a few in the 140s.  Nothing in the 120s.  He is actually doing well he does notice a difference on days where his pressure is a little higher.  He is also noticed a little bit of difference in his exercise endurance.  He has lost over 20 pounds since I last saw him he wanted to get more physically fit and he has been doing well and feeling better in that regard.    Past Medical History:  Diagnosis Date  . Essential hypertension 08/31/2012    No past surgical history on file.  Family History  Problem Relation Age of Onset  . Hypertension Mother   . Depression Mother   . Hypothyroidism Mother     Social History   Socioeconomic History  . Marital status: Single    Spouse name: Not on file  . Number of children: Not on file  . Years of education: Not on file  . Highest education level: Not on file  Occupational History  . Not on file  Tobacco Use  . Smoking status: Never Smoker  . Smokeless tobacco: Never Used  Substance and Sexual Activity  . Alcohol use: Yes    Comment: 1 drink per week  . Drug use: No  . Sexual activity: Not on file  Other Topics Concern  . Not on file  Social History Narrative  . Not on file   Social Determinants of Health   Financial Resource Strain:   . Difficulty of Paying Living Expenses:   Food Insecurity:   . Worried About Programme researcher, broadcasting/film/video in the Last Year:   . Barista in the Last Year:   Transportation Needs:   . Freight forwarder (Medical):   Marland Kitchen Lack of Transportation (Non-Medical):   Physical Activity:   . Days of  Exercise per Week:   . Minutes of Exercise per Session:   Stress:   . Feeling of Stress :   Social Connections:   . Frequency of Communication with Friends and Family:   . Frequency of Social Gatherings with Friends and Family:   . Attends Religious Services:   . Active Member of Clubs or Organizations:   . Attends Banker Meetings:   Marland Kitchen Marital Status:   Intimate Partner Violence:   . Fear of Current or Ex-Partner:   . Emotionally Abused:   Marland Kitchen Physically Abused:   . Sexually Abused:     Outpatient Medications Prior to Visit  Medication Sig Dispense Refill  . atenolol (TENORMIN) 50 MG tablet TAKE 1 TABLET BY MOUTH EVERY DAY 90 tablet 2   No facility-administered medications prior to visit.    No Known Allergies  ROS Review of Systems    Objective:    Physical Exam  Constitutional: He is oriented to person, place, and time. He appears well-developed and well-nourished.  HENT:  Head: Normocephalic and atraumatic.  Neck: No thyromegaly present.  Cardiovascular: Normal rate, regular rhythm and normal heart sounds.  Pulmonary/Chest: Effort normal and breath sounds normal.  Musculoskeletal:     Cervical back: Neck supple.  Lymphadenopathy:    He has no cervical adenopathy.  Neurological: He is alert and oriented to person, place, and time.  Skin: Skin is warm and dry.  Psychiatric: He has a normal mood and affect. His behavior is normal.    BP (!) 126/57   Pulse (!) 54   Ht 6' (1.829 m)   Wt 181 lb (82.1 kg)   SpO2 100%   BMI 24.55 kg/m  Wt Readings from Last 3 Encounters:  12/08/19 181 lb (82.1 kg)  11/24/18 203 lb 12.8 oz (92.4 kg)  07/07/18 211 lb (95.7 kg)     There are no preventive care reminders to display for this patient.  There are no preventive care reminders to display for this patient.  Lab Results  Component Value Date   TSH 1.41 07/07/2018   Lab Results  Component Value Date   WBC 6.0 03/29/2015   HGB 14.8 03/29/2015   HCT  43.8 03/29/2015   MCV 85.0 03/29/2015   PLT 229 03/29/2015   Lab Results  Component Value Date   NA 140 07/07/2018   K 4.5 07/07/2018   CO2 26 07/07/2018   GLUCOSE 96 07/07/2018   BUN 12 07/07/2018   CREATININE 0.99 07/07/2018   BILITOT 0.8 07/07/2018   ALKPHOS 73 01/18/2016   AST 20 07/07/2018   ALT 22 07/07/2018   PROT 7.1 07/07/2018   ALBUMIN 4.3 01/18/2016   CALCIUM 9.5 07/07/2018   Lab Results  Component Value Date   CHOL 250 (H) 07/07/2018   Lab Results  Component Value Date   HDL 38 (L) 07/07/2018   Lab Results  Component Value Date   LDLCALC 171 (H) 07/07/2018   Lab Results  Component Value Date   TRIG 243 (H) 07/07/2018   Lab Results  Component Value Date   CHOLHDL 6.6 (H) 07/07/2018   No results found for: HGBA1C    Assessment & Plan:   Problem List Items Addressed This Visit      Cardiovascular and Mediastinum   Essential hypertension - Primary    Blood pressures are fair but not optimal we discussed that we really want a get a systolic pressure in the 324M if at all possible.  Because of some issues with not being to reach max heart rate during exercise I would like to take him off of beta-blocker and switch him to an ACE inhibitor.  We will start with lisinopril 20 mg one by potential side effects.      Relevant Medications   lisinopril (ZESTRIL) 20 MG tablet   Other Relevant Orders   COMPLETE METABOLIC PANEL WITH GFR   Lipid Panel w/reflex Direct LDL     Other   Hyperlipidemia    Elevated lipid levels.  Due to recheck those today.  He has lost a significant amount of weight so I am hoping that the lipids are much better.      Relevant Medications   lisinopril (ZESTRIL) 20 MG tablet   Other Relevant Orders   COMPLETE METABOLIC PANEL WITH GFR   Lipid Panel w/reflex Direct LDL      Meds ordered this encounter  Medications  . lisinopril (ZESTRIL) 20 MG tablet    Sig: Take 1 tablet (20 mg total) by mouth daily.    Dispense:  90  tablet    Refill:  0    Follow-up: Return in about 1 month (around 01/07/2020) for Hypertension, New start medication.  Beatrice Lecher, MD

## 2019-12-14 ENCOUNTER — Encounter: Payer: Self-pay | Admitting: Sports Medicine

## 2019-12-14 ENCOUNTER — Telehealth: Payer: Self-pay

## 2019-12-14 ENCOUNTER — Ambulatory Visit (INDEPENDENT_AMBULATORY_CARE_PROVIDER_SITE_OTHER): Payer: BC Managed Care – PPO | Admitting: Sports Medicine

## 2019-12-14 ENCOUNTER — Other Ambulatory Visit: Payer: Self-pay

## 2019-12-14 DIAGNOSIS — I10 Essential (primary) hypertension: Secondary | ICD-10-CM | POA: Diagnosis not present

## 2019-12-14 MED ORDER — LISINOPRIL 10 MG PO TABS: 10.0000 mg | ORAL_TABLET | Freq: Every day | ORAL | 3 refills | Status: DC

## 2019-12-14 NOTE — Telephone Encounter (Signed)
Timothy Shea was recently switched from atenolol to lisinopril. He did stop the atenolol and start the lisinopril. He states he noticed shortness of breath and tightness below ribs. He states he feels the same as he did the times he missed taking the atenolol. He has been scheduled for an appointment today.

## 2019-12-14 NOTE — Progress Notes (Signed)
    Procedures performed today:    None.  Independent interpretation of notes and tests performed by another provider:   None.  Brief History, Exam, Impression, and Recommendations:    Essential hypertension Timothy Shea returns, he is a pleasant 24 year old male, he has a history of hypertension, historically on atenolol. He did notice some exercise intolerance, he was switched to lisinopril 20 mg. Since then he has noted weakness, mild orthostasis, occasional heaviness in his chest. His blood pressure is relatively low today, pulse rate continues to be low as is typical for him. Otherwise his exam is completely unremarkable, decreasing lisinopril to 10 mg daily with a 2-week follow-up in the office.     ___________________________________________ Ihor Austin. Benjamin Stain, M.D., ABFM., CAQSM. Primary Care and Sports Medicine [facility]  Adjunct Instructor of Family Medicine  [employer]

## 2019-12-14 NOTE — Assessment & Plan Note (Signed)
Timothy Shea returns, he is a pleasant 24 year old male, he has a history of hypertension, historically on atenolol. He did notice some exercise intolerance, he was switched to lisinopril 20 mg. Since then he has noted weakness, mild orthostasis, occasional heaviness in his chest. His blood pressure is relatively low today, pulse rate continues to be low as is typical for him. Otherwise his exam is completely unremarkable, decreasing lisinopril to 10 mg daily with a 2-week follow-up in the office.

## 2019-12-14 NOTE — Telephone Encounter (Addendum)
I think this was supposed to go to Dr. Linford Arnold.  Update: Never mind, I guess I am seeing him today.

## 2019-12-28 ENCOUNTER — Ambulatory Visit (INDEPENDENT_AMBULATORY_CARE_PROVIDER_SITE_OTHER): Payer: BC Managed Care – PPO | Admitting: Family Medicine

## 2019-12-28 ENCOUNTER — Encounter: Payer: Self-pay | Admitting: Family Medicine

## 2019-12-28 VITALS — BP 128/73 | HR 73 | Ht 72.0 in | Wt 183.0 lb

## 2019-12-28 DIAGNOSIS — R0789 Other chest pain: Secondary | ICD-10-CM

## 2019-12-28 DIAGNOSIS — M62838 Other muscle spasm: Secondary | ICD-10-CM | POA: Diagnosis not present

## 2019-12-28 DIAGNOSIS — I10 Essential (primary) hypertension: Secondary | ICD-10-CM | POA: Diagnosis not present

## 2019-12-28 NOTE — Assessment & Plan Note (Signed)
Pressure looks great today.  For now we will continue with current regimen but will check for electrolyte abnormalities.

## 2019-12-28 NOTE — Progress Notes (Signed)
Home bp cuff : 133/77 P: 69

## 2019-12-28 NOTE — Progress Notes (Signed)
Established Patient Office Visit  Subjective:  Patient ID: Timothy Shea, male    DOB: September 04, 1995  Age: 24 y.o. MRN: [id number]  CC:  Chief Complaint  Patient presents with  . Hypertension    HPI Timothy Shea presents for lower chest discomfort.  He really says it almost feels like a muscle flexing in the mid epigastric area.  He says is not really painful unless it continues 4 hours in which case at that point it does start to get really uncomfortable most like muscle fatigue.  He says it happened twice.  The first time was a couple days after he started lisinopril as we have taken him off of the atenolol and then it happened again yesterday he has noticed it a few times just very very briefly in between but yesterday it actually lasted for hours to the point that he actually left work early.  He denies any nausea heartburn reflux symptoms.  He denies any belching or constipation.  No other muscle cramping.  He says he was able to work out this weekend and did well he did not have any symptoms.  Did bring in his home blood pressure cuff has been tracking them at home they have mostly been running in the 130s and 140s.  We got a great blood pressure here.  Past Medical History:  Diagnosis Date  . Essential hypertension 08/31/2012    No past surgical history on file.  Family History  Problem Relation Age of Onset  . Hypertension Mother   . Depression Mother   . Hypothyroidism Mother     Social History   Socioeconomic History  . Marital status: Single    Spouse name: Not on file  . Number of children: Not on file  . Years of education: Not on file  . Highest education level: Not on file  Occupational History  . Not on file  Tobacco Use  . Smoking status: Never Smoker  . Smokeless tobacco: Never Used  Substance and Sexual Activity  . Alcohol use: Yes    Comment: 1 drink per week  . Drug use: No  . Sexual activity: Not on file  Other Topics Concern  . Not on file   Social History Narrative  . Not on file   Social Determinants of Health   Financial Resource Strain:   . Difficulty of Paying Living Expenses:   Food Insecurity:   . Worried About Programme researcher, broadcasting/film/video in the Last Year:   . Barista in the Last Year:   Transportation Needs:   . Freight forwarder (Medical):   Marland Kitchen Lack of Transportation (Non-Medical):   Physical Activity:   . Days of Exercise per Week:   . Minutes of Exercise per Session:   Stress:   . Feeling of Stress :   Social Connections:   . Frequency of Communication with Friends and Family:   . Frequency of Social Gatherings with Friends and Family:   . Attends Religious Services:   . Active Member of Clubs or Organizations:   . Attends Banker Meetings:   Marland Kitchen Marital Status:   Intimate Partner Violence:   . Fear of Current or Ex-Partner:   . Emotionally Abused:   Marland Kitchen Physically Abused:   . Sexually Abused:     Outpatient Medications Prior to Visit  Medication Sig Dispense Refill  . lisinopril (ZESTRIL) 10 MG tablet Take 1 tablet (10 mg total) by mouth daily. 30 tablet 3  No facility-administered medications prior to visit.    No Known Allergies  ROS Review of Systems    Objective:    Physical Exam  Constitutional: He is oriented to person, place, and time. He appears well-developed and well-nourished.  HENT:  Head: Normocephalic and atraumatic.  Cardiovascular: Normal rate, regular rhythm and normal heart sounds.  Pulmonary/Chest: Effort normal and breath sounds normal.  Abdominal: Soft. Bowel sounds are normal. He exhibits no distension and no mass. There is no abdominal tenderness. There is no rebound and no guarding.  Neurological: He is alert and oriented to person, place, and time.  Skin: Skin is warm and dry.  Psychiatric: He has a normal mood and affect. His behavior is normal.    BP 128/73   Pulse 73   Ht 6' (1.829 m)   Wt 183 lb (83 kg)   SpO2 100%   BMI 24.82 kg/m   Wt Readings from Last 3 Encounters:  12/28/19 183 lb (83 kg)  12/14/19 181 lb (82.1 kg)  12/08/19 181 lb (82.1 kg)     There are no preventive care reminders to display for this patient.  There are no preventive care reminders to display for this patient.  Lab Results  Component Value Date   TSH 1.41 07/07/2018   Lab Results  Component Value Date   WBC 6.0 03/29/2015   HGB 14.8 03/29/2015   HCT 43.8 03/29/2015   MCV 85.0 03/29/2015   PLT 229 03/29/2015   Lab Results  Component Value Date   NA 140 12/08/2019   K 4.2 12/08/2019   CO2 30 12/08/2019   GLUCOSE 108 (H) 12/08/2019   BUN 12 12/08/2019   CREATININE 0.96 12/08/2019   BILITOT 0.5 12/08/2019   ALKPHOS 73 01/18/2016   AST 17 12/08/2019   ALT 18 12/08/2019   PROT 6.8 12/08/2019   ALBUMIN 4.3 01/18/2016   CALCIUM 9.7 12/08/2019   Lab Results  Component Value Date   CHOL 182 12/08/2019   Lab Results  Component Value Date   HDL 42 12/08/2019   Lab Results  Component Value Date   LDLCALC 116 (H) 12/08/2019   Lab Results  Component Value Date   TRIG 129 12/08/2019   Lab Results  Component Value Date   CHOLHDL 4.3 12/08/2019   No results found for: HGBA1C    Assessment & Plan:   Problem List Items Addressed This Visit      Cardiovascular and Mediastinum   Essential hypertension - Primary    Pressure looks great today.  For now we will continue with current regimen but will check for electrolyte abnormalities.      Relevant Orders   CK (Creatine Kinase)   BASIC METABOLIC PANEL WITH GFR   TSH    Other Visit Diagnoses    Atypical chest pain       Relevant Orders   EKG 12-Lead   CK (Creatine Kinase)   BASIC METABOLIC PANEL WITH GFR   TSH   Muscle spasm       Relevant Orders   CK (Creatine Kinase)   BASIC METABOLIC PANEL WITH GFR   TSH     Lower chest pain/muscle spasm-unclear etiology he describes it as more of a tightness.  Or flexing of the muscle.  He says it does not feel quite  like a cramp.  We will check for electrolyte abnormalities since we did recently switch him to an ACE inhibitor there is the potential for hyperkalemia and hyponatremia.  We  will also do an EKG today.    No orders of the defined types were placed in this encounter.   Follow-up: Return in about 6 months (around 06/29/2020) for Hypertension.    Nani Gasser, MD

## 2019-12-29 LAB — BASIC METABOLIC PANEL WITH GFR
BUN: 13 mg/dL (ref 7–25)
CO2: 25 mmol/L (ref 20–32)
Calcium: 9.7 mg/dL (ref 8.6–10.3)
Chloride: 104 mmol/L (ref 98–110)
Creat: 0.93 mg/dL (ref 0.60–1.35)
GFR, Est African American: 132 mL/min/{1.73_m2} (ref >=60)
GFR, Est Non African American: 114 mL/min/{1.73_m2} (ref >=60)
Glucose, Bld: 96 mg/dL (ref 65–99)
Potassium: 4.7 mmol/L (ref 3.5–5.3)
Sodium: 138 mmol/L (ref 135–146)

## 2019-12-29 LAB — CK: Total CK: 158 U/L (ref 44–196)

## 2019-12-29 LAB — TSH: TSH: 3.08 mIU/L (ref 0.40–4.50)

## 2020-01-12 ENCOUNTER — Ambulatory Visit: Payer: BC Managed Care – PPO | Admitting: Family Medicine

## 2020-02-14 ENCOUNTER — Other Ambulatory Visit: Payer: Self-pay | Admitting: Sports Medicine

## 2020-02-14 DIAGNOSIS — I10 Essential (primary) hypertension: Secondary | ICD-10-CM

## 2020-02-14 NOTE — Telephone Encounter (Signed)
To PCP

## 2020-02-28 ENCOUNTER — Other Ambulatory Visit: Payer: Self-pay | Admitting: Family Medicine

## 2020-03-02 ENCOUNTER — Other Ambulatory Visit: Payer: Self-pay

## 2020-03-02 ENCOUNTER — Encounter: Payer: Self-pay | Admitting: Family Medicine

## 2020-03-02 ENCOUNTER — Ambulatory Visit (INDEPENDENT_AMBULATORY_CARE_PROVIDER_SITE_OTHER): Payer: BC Managed Care – PPO

## 2020-03-02 ENCOUNTER — Ambulatory Visit (INDEPENDENT_AMBULATORY_CARE_PROVIDER_SITE_OTHER): Payer: BC Managed Care – PPO | Admitting: Family Medicine

## 2020-03-02 VITALS — BP 128/66 | HR 61 | Ht 72.0 in | Wt 185.0 lb

## 2020-03-02 DIAGNOSIS — R079 Chest pain, unspecified: Secondary | ICD-10-CM | POA: Diagnosis not present

## 2020-03-02 DIAGNOSIS — R0789 Other chest pain: Secondary | ICD-10-CM | POA: Diagnosis not present

## 2020-03-02 DIAGNOSIS — R1013 Epigastric pain: Secondary | ICD-10-CM

## 2020-03-02 DIAGNOSIS — I10 Essential (primary) hypertension: Secondary | ICD-10-CM

## 2020-03-02 NOTE — Progress Notes (Signed)
Established Patient Office Visit  Subjective:  Patient ID: Timothy Shea, male    DOB: 10/20/1995  Age: 24 y.o. MRN: [id number]  CC:  Chief Complaint  Patient presents with  . Follow-up    HPI Timothy Shea presents for a sensation behind his sternum that feels like a marble in his chest.  When I saw him in May he had actually mentioned some lower chest discomfort that he describes them as feeling like a muscle flexing in the epigastric area sometimes it would actually last for hours and because of that would be uncomfortable but he denied any significant pain.  He denied any nausea heartburn belching or constipation at that time.  He says since I last saw him he actually had a couple episodes where he felt more like a discomfort behind the mid sternum.  So he stopped the lisinopril he says that has gone away but he still having this pain more in the lower chest/epigastric area where he again feels the most like it is a tightening of the diaphragm or the muscles.  Again it can last for hours sometimes half a day.  Sometimes it will start out in the beginning of the day and then get better and then sometimes it gets worse later in the day.  He says if he tries to arch his back and really stretch out the area and try to relax muscles it actually will make him feel lightheaded for about 5 minutes.  But he says if he tries to crawl forward and really tuck the abdomen sometimes it will actually feel a little bit better.  Says it happens about 3-4 times a week.  He denies any cough or sputum production.  He has a he says occasionally he will notice that his breathing is a little bit more shallow.  Off the lisinopril x 2 weeks and he feels better.    Past Medical History:  Diagnosis Date  . Essential hypertension 08/31/2012    History reviewed. No pertinent surgical history.  Family History  Problem Relation Age of Onset  . Hypertension Mother   . Depression Mother   . Hypothyroidism Mother      Social History   Socioeconomic History  . Marital status: Single    Spouse name: Not on file  . Number of children: Not on file  . Years of education: Not on file  . Highest education level: Not on file  Occupational History  . Not on file  Tobacco Use  . Smoking status: Never Smoker  . Smokeless tobacco: Never Used  Vaping Use  . Vaping Use: Never used  Substance and Sexual Activity  . Alcohol use: Yes    Comment: 1 drink per week  . Drug use: No  . Sexual activity: Not on file  Other Topics Concern  . Not on file  Social History Narrative  . Not on file   Social Determinants of Health   Financial Resource Strain:   . Difficulty of Paying Living Expenses:   Food Insecurity:   . Worried About Programme researcher, broadcasting/film/video in the Last Year:   . Barista in the Last Year:   Transportation Needs:   . Freight forwarder (Medical):   Marland Kitchen Lack of Transportation (Non-Medical):   Physical Activity:   . Days of Exercise per Week:   . Minutes of Exercise per Session:   Stress:   . Feeling of Stress :   Social Connections:   .  Frequency of Communication with Friends and Family:   . Frequency of Social Gatherings with Friends and Family:   . Attends Religious Services:   . Active Member of Clubs or Organizations:   . Attends Banker Meetings:   Marland Kitchen Marital Status:   Intimate Partner Violence:   . Fear of Current or Ex-Partner:   . Emotionally Abused:   Marland Kitchen Physically Abused:   . Sexually Abused:     Outpatient Medications Prior to Visit  Medication Sig Dispense Refill  . lisinopril (ZESTRIL) 10 MG tablet TAKE 1 TABLET BY MOUTH EVERY DAY 90 tablet 1   No facility-administered medications prior to visit.    No Known Allergies  ROS Review of Systems    Objective:    Physical Exam Constitutional:      Appearance: He is well-developed.  HENT:     Head: Normocephalic and atraumatic.  Cardiovascular:     Rate and Rhythm: Normal rate and regular  rhythm.     Heart sounds: Normal heart sounds.     Comments: No renal or aortic bruits. Pulmonary:     Effort: Pulmonary effort is normal.     Breath sounds: Normal breath sounds.  Skin:    General: Skin is warm and dry.  Neurological:     Mental Status: He is alert and oriented to person, place, and time.  Psychiatric:        Behavior: Behavior normal.     BP 128/66   Pulse 61   Ht 6' (1.829 m)   Wt 185 lb (83.9 kg)   SpO2 100%   BMI 25.09 kg/m  Wt Readings from Last 3 Encounters:  03/02/20 185 lb (83.9 kg)  12/28/19 183 lb (83 kg)  12/14/19 181 lb (82.1 kg)     Health Maintenance Due  Topic Date Due  . Hepatitis C Screening  Never done    There are no preventive care reminders to display for this patient.  Lab Results  Component Value Date   TSH 3.08 12/28/2019   Lab Results  Component Value Date   WBC 6.0 03/29/2015   HGB 14.8 03/29/2015   HCT 43.8 03/29/2015   MCV 85.0 03/29/2015   PLT 229 03/29/2015   Lab Results  Component Value Date   NA 138 12/28/2019   K 4.7 12/28/2019   CO2 25 12/28/2019   GLUCOSE 96 12/28/2019   BUN 13 12/28/2019   CREATININE 0.93 12/28/2019   BILITOT 0.5 12/08/2019   ALKPHOS 73 01/18/2016   AST 17 12/08/2019   ALT 18 12/08/2019   PROT 6.8 12/08/2019   ALBUMIN 4.3 01/18/2016   CALCIUM 9.7 12/28/2019   Lab Results  Component Value Date   CHOL 182 12/08/2019   Lab Results  Component Value Date   HDL 42 12/08/2019   Lab Results  Component Value Date   LDLCALC 116 (H) 12/08/2019   Lab Results  Component Value Date   TRIG 129 12/08/2019   Lab Results  Component Value Date   CHOLHDL 4.3 12/08/2019   No results found for: HGBA1C    Assessment & Plan:   Problem List Items Addressed This Visit      Cardiovascular and Mediastinum   Essential hypertension    Blood pressure actually looks fantastic today and has been off of his blood pressure pill for about 2 weeks so for now we will just continue to monitor  it if we do need to start him back on medication maybe we can  pick a diuretic or calcium channel blocker.       Other Visit Diagnoses    Atypical chest pain    -  Primary   Relevant Orders   DG Chest 2 View   ECHOCARDIOGRAM COMPLETE   Epigastric pain       Relevant Orders   DG Chest 2 View   ECHOCARDIOGRAM COMPLETE     Atypical chest pain-unclear etiology which she did an EKG last summer he was years would like to move forward with an echocardiogram and a chest x-ray today.  If those are normal we can start looking into more abdominal etiologies.  Just a little unclear.  With the muscle cramping/spasming wonder if it could be some type of neurologic disorder in which case we might even consider consultation with neurology is just very unusual he is not having any associated nausea vomiting or change in bowels.  No palpitations etc.  No orders of the defined types were placed in this encounter.   Follow-up: Return if symptoms worsen or fail to improve.    Nani Gasser, MD

## 2020-03-02 NOTE — Assessment & Plan Note (Signed)
Blood pressure actually looks fantastic today and has been off of his blood pressure pill for about 2 weeks so for now we will just continue to monitor it if we do need to start him back on medication maybe we can pick a diuretic or calcium channel blocker.

## 2020-03-07 ENCOUNTER — Other Ambulatory Visit: Payer: Self-pay | Admitting: Family Medicine

## 2020-03-07 DIAGNOSIS — R1013 Epigastric pain: Secondary | ICD-10-CM

## 2020-03-07 NOTE — Progress Notes (Signed)
US ordered

## 2020-03-09 ENCOUNTER — Ambulatory Visit (INDEPENDENT_AMBULATORY_CARE_PROVIDER_SITE_OTHER): Payer: BC Managed Care – PPO

## 2020-03-09 ENCOUNTER — Other Ambulatory Visit: Payer: Self-pay

## 2020-03-09 DIAGNOSIS — R1013 Epigastric pain: Secondary | ICD-10-CM

## 2020-06-28 ENCOUNTER — Other Ambulatory Visit: Payer: Self-pay

## 2020-06-28 ENCOUNTER — Ambulatory Visit (INDEPENDENT_AMBULATORY_CARE_PROVIDER_SITE_OTHER): Payer: BC Managed Care – PPO | Admitting: Family Medicine

## 2020-06-28 ENCOUNTER — Encounter: Payer: Self-pay | Admitting: Family Medicine

## 2020-06-28 VITALS — BP 118/69 | HR 56 | Ht 72.0 in | Wt 183.0 lb

## 2020-06-28 DIAGNOSIS — I10 Essential (primary) hypertension: Secondary | ICD-10-CM

## 2020-06-28 DIAGNOSIS — Z23 Encounter for immunization: Secondary | ICD-10-CM

## 2020-06-28 DIAGNOSIS — M62838 Other muscle spasm: Secondary | ICD-10-CM

## 2020-06-28 MED ORDER — ATENOLOL 25 MG PO TABS: 25.0000 mg | ORAL_TABLET | Freq: Every day | ORAL | 1 refills | Status: DC

## 2020-06-28 NOTE — Assessment & Plan Note (Signed)
Well controlled. Continue current regimen. Follow up in  6 mo . Labs are up to date.  

## 2020-06-28 NOTE — Progress Notes (Signed)
Established Patient Office Visit  Subjective:  Patient ID: Timothy Shea, male    DOB: May 16, 1996  Age: 24 y.o. MRN: [id number]  CC:  Chief Complaint  Patient presents with  . Hypertension    HPI Timothy Shea presents for   Hypertension- Pt denies chest pain, SOB, dizziness, or heart palpitations.  Taking meds as directed w/o problems.  Denies medication side effects.  Only been taking a half a tab of the atenolol and doing well.  This time we had actually discussed holding the medication completely because his blood pressure looks great.  But he did eventually restart a half a tab.  He is still having that tightening sensation in the diaphragm area occasionally its not been happening with exercise like it did initially he still been able to workout regularly.  He says he is beginning to wonder if it might actually be stress-induced.  He notices it happens more when he is feeling stressed.  Labs have been normal.  Did an abdominal ultrasound and chest x-ray which was normal.  Patient reports that work has been okay but getting back in the dating scene has been a little bit stressful.   Past Medical History:  Diagnosis Date  . Essential hypertension 08/31/2012    History reviewed. No pertinent surgical history.  Family History  Problem Relation Age of Onset  . Hypertension Mother   . Depression Mother   . Hypothyroidism Mother     Social History   Socioeconomic History  . Marital status: Single    Spouse name: Not on file  . Number of children: Not on file  . Years of education: Not on file  . Highest education level: Not on file  Occupational History  . Not on file  Tobacco Use  . Smoking status: Never Smoker  . Smokeless tobacco: Never Used  Vaping Use  . Vaping Use: Never used  Substance and Sexual Activity  . Alcohol use: Yes    Comment: 1 drink per week  . Drug use: No  . Sexual activity: Not on file  Other Topics Concern  . Not on file  Social  History Narrative  . Not on file   Social Determinants of Health   Financial Resource Strain:   . Difficulty of Paying Living Expenses: Not on file  Food Insecurity:   . Worried About Programme researcher, broadcasting/film/video in the Last Year: Not on file  . Ran Out of Food in the Last Year: Not on file  Transportation Needs:   . Lack of Transportation (Medical): Not on file  . Lack of Transportation (Non-Medical): Not on file  Physical Activity:   . Days of Exercise per Week: Not on file  . Minutes of Exercise per Session: Not on file  Stress:   . Feeling of Stress : Not on file  Social Connections:   . Frequency of Communication with Friends and Family: Not on file  . Frequency of Social Gatherings with Friends and Family: Not on file  . Attends Religious Services: Not on file  . Active Member of Clubs or Organizations: Not on file  . Attends Banker Meetings: Not on file  . Marital Status: Not on file  Intimate Partner Violence:   . Fear of Current or Ex-Partner: Not on file  . Emotionally Abused: Not on file  . Physically Abused: Not on file  . Sexually Abused: Not on file    No outpatient medications prior to visit.   No facility-administered  medications prior to visit.    No Known Allergies  ROS Review of Systems    Objective:    Physical Exam Constitutional:      Appearance: He is well-developed.  HENT:     Head: Normocephalic and atraumatic.  Cardiovascular:     Rate and Rhythm: Normal rate and regular rhythm.     Heart sounds: Normal heart sounds.  Pulmonary:     Effort: Pulmonary effort is normal.     Breath sounds: Normal breath sounds.  Skin:    General: Skin is warm and dry.  Neurological:     Mental Status: He is alert and oriented to person, place, and time.  Psychiatric:        Behavior: Behavior normal.     BP 118/69   Pulse (!) 56   Ht 6' (1.829 m)   Wt 183 lb (83 kg)   SpO2 100%   BMI 24.82 kg/m  Wt Readings from Last 3 Encounters:   06/28/20 183 lb (83 kg)  03/02/20 185 lb (83.9 kg)  12/28/19 183 lb (83 kg)     There are no preventive care reminders to display for this patient.  There are no preventive care reminders to display for this patient.  Lab Results  Component Value Date   TSH 3.08 12/28/2019   Lab Results  Component Value Date   WBC 6.0 03/29/2015   HGB 14.8 03/29/2015   HCT 43.8 03/29/2015   MCV 85.0 03/29/2015   PLT 229 03/29/2015   Lab Results  Component Value Date   NA 138 12/28/2019   K 4.7 12/28/2019   CO2 25 12/28/2019   GLUCOSE 96 12/28/2019   BUN 13 12/28/2019   CREATININE 0.93 12/28/2019   BILITOT 0.5 12/08/2019   ALKPHOS 73 01/18/2016   AST 17 12/08/2019   ALT 18 12/08/2019   PROT 6.8 12/08/2019   ALBUMIN 4.3 01/18/2016   CALCIUM 9.7 12/28/2019   Lab Results  Component Value Date   CHOL 182 12/08/2019   Lab Results  Component Value Date   HDL 42 12/08/2019   Lab Results  Component Value Date   LDLCALC 116 (H) 12/08/2019   Lab Results  Component Value Date   TRIG 129 12/08/2019   Lab Results  Component Value Date   CHOLHDL 4.3 12/08/2019   No results found for: HGBA1C    Assessment & Plan:   Problem List Items Addressed This Visit      Cardiovascular and Mediastinum   Essential hypertension - Primary    Well controlled. Continue current regimen. Follow up in  6 mo. Labs are up to date.       Relevant Medications   atenolol (TENORMIN) 25 MG tablet    Other Visit Diagnoses    Need for immunization against influenza       Relevant Orders   Flu Vaccine QUAD 36+ mos IM (Completed)   Muscle spasm         Spasm in the muscle/diaphragm epigastric area-I agree it seems to be stress triggered we have not notified or pinpoint any underlying causes does not happen with activity or exercise which is reassuring.  Continue to work on reducing stress levels.  Well controlled. Continue current regimen. Follow up in  6 months.   Meds ordered this encounter   Medications  . atenolol (TENORMIN) 25 MG tablet    Sig: Take 1 tablet (25 mg total) by mouth daily.    Dispense:  90 tablet  Refill:  1    Follow-up: Return in about 6 months (around 12/26/2020) for Hypertension.    Nani Gasser, MD

## 2020-07-19 ENCOUNTER — Other Ambulatory Visit: Payer: Self-pay

## 2020-07-19 ENCOUNTER — Ambulatory Visit (INDEPENDENT_AMBULATORY_CARE_PROVIDER_SITE_OTHER): Payer: BC Managed Care – PPO | Admitting: Family Medicine

## 2020-07-19 ENCOUNTER — Encounter: Payer: Self-pay | Admitting: Family Medicine

## 2020-07-19 VITALS — BP 127/75 | HR 54 | Ht 72.0 in | Wt 187.0 lb

## 2020-07-19 DIAGNOSIS — I10 Essential (primary) hypertension: Secondary | ICD-10-CM | POA: Diagnosis not present

## 2020-07-19 DIAGNOSIS — R0602 Shortness of breath: Secondary | ICD-10-CM | POA: Diagnosis not present

## 2020-07-19 DIAGNOSIS — R0789 Other chest pain: Secondary | ICD-10-CM | POA: Diagnosis not present

## 2020-07-19 MED ORDER — ALBUTEROL SULFATE HFA 108 (90 BASE) MCG/ACT IN AERS: 2.0000 | INHALATION_SPRAY | Freq: Four times a day (QID) | RESPIRATORY_TRACT | 0 refills | Status: DC | PRN

## 2020-07-19 MED ORDER — VALSARTAN 40 MG PO TABS: 40.0000 mg | ORAL_TABLET | Freq: Every day | ORAL | 0 refills | Status: DC

## 2020-07-19 NOTE — Progress Notes (Signed)
He reports that he has been experiencing this on/off for several months.  He stated that it is hard for him to get a full breath and he gets a burning sensation like sharp needle pain in his sternum but more like burning all over.

## 2020-07-19 NOTE — Progress Notes (Signed)
Established Patient Office Visit  Subjective:  Patient ID: Timothy Shea, male    DOB: 01-27-1996  Age: 24 y.o. MRN: [id number]  CC:  Chief Complaint  Patient presents with  . Breathing Problem    HPI Timothy Shea presents for SOB that can occur intermittently.  Not necessarily worsened by exercise but can be exacerbated by exercise.  Has been getting some.  Prick sensations through his chest as well as a burning sensation.  He says he will start to feel short of breath like it is hard to get a deep breath then will start taking more shallow breaths and feel like he gets a burning sensation in his throat and upper chest.  He says he will get lightheaded and have low energy as well he says it is hard to get in a "full breath".  He said he actually tried some over-the-counter asthma medication, Primatene Mist and feels like it helped with the burning sensation but it did not really help with the feeling like he could not get a deep breath then..  It did cause some throat irritation.  Says he is even called out of work for this.  He says normally in the past when he would ramp-up it would just be for couple days but this time it has persisted it started ramping up last week and just has continued daily.  He had a normal chest x-ray in July as well as an upper abdominal ultrasound because again at that time he was complaining more of epigastric discomfort and feeling like there was a spasm in his diaphragm.  Past Medical History:  Diagnosis Date  . Essential hypertension 08/31/2012    No past surgical history on file.  Family History  Problem Relation Age of Onset  . Hypertension Mother   . Depression Mother   . Hypothyroidism Mother     Social History   Socioeconomic History  . Marital status: Single    Spouse name: Not on file  . Number of children: Not on file  . Years of education: Not on file  . Highest education level: Not on file  Occupational History  . Not on file   Tobacco Use  . Smoking status: Never Smoker  . Smokeless tobacco: Never Used  Vaping Use  . Vaping Use: Never used  Substance and Sexual Activity  . Alcohol use: Yes    Comment: 1 drink per week  . Drug use: No  . Sexual activity: Not on file  Other Topics Concern  . Not on file  Social History Narrative  . Not on file   Social Determinants of Health   Financial Resource Strain: Not on file  Food Insecurity: Not on file  Transportation Needs: Not on file  Physical Activity: Not on file  Stress: Not on file  Social Connections: Not on file  Intimate Partner Violence: Not on file    Outpatient Medications Prior to Visit  Medication Sig Dispense Refill  . atenolol (TENORMIN) 25 MG tablet Take 1 tablet (25 mg total) by mouth daily. 90 tablet 1   No facility-administered medications prior to visit.    No Known Allergies  ROS Review of Systems    Objective:    Physical Exam Constitutional:      Appearance: He is well-developed and well-nourished.  HENT:     Head: Normocephalic and atraumatic.  Cardiovascular:     Rate and Rhythm: Normal rate and regular rhythm.     Heart sounds: Normal heart  sounds.  Pulmonary:     Effort: Pulmonary effort is normal.     Breath sounds: Normal breath sounds.  Skin:    General: Skin is warm and dry.  Neurological:     Mental Status: He is alert and oriented to person, place, and time.  Psychiatric:        Mood and Affect: Mood and affect normal.        Behavior: Behavior normal.     BP 127/75   Pulse (!) 54   Ht 6' (1.829 m)   Wt 187 lb (84.8 kg)   SpO2 100%   PF 800 L/min Comment: [phone] (pt is in the green zone)  BMI 25.36 kg/m  Wt Readings from Last 3 Encounters:  07/19/20 187 lb (84.8 kg)  06/28/20 183 lb (83 kg)  03/02/20 185 lb (83.9 kg)     Health Maintenance Due  Topic Date Due  . COVID-19 Vaccine (2 - Booster for Janssen series) 01/04/2020    There are no preventive care reminders to display for  this patient.  Lab Results  Component Value Date   TSH 3.08 12/28/2019   Lab Results  Component Value Date   WBC 6.0 03/29/2015   HGB 14.8 03/29/2015   HCT 43.8 03/29/2015   MCV 85.0 03/29/2015   PLT 229 03/29/2015   Lab Results  Component Value Date   NA 138 12/28/2019   K 4.7 12/28/2019   CO2 25 12/28/2019   GLUCOSE 96 12/28/2019   BUN 13 12/28/2019   CREATININE 0.93 12/28/2019   BILITOT 0.5 12/08/2019   ALKPHOS 73 01/18/2016   AST 17 12/08/2019   ALT 18 12/08/2019   PROT 6.8 12/08/2019   ALBUMIN 4.3 01/18/2016   CALCIUM 9.7 12/28/2019   Lab Results  Component Value Date   CHOL 182 12/08/2019   Lab Results  Component Value Date   HDL 42 12/08/2019   Lab Results  Component Value Date   LDLCALC 116 (H) 12/08/2019   Lab Results  Component Value Date   TRIG 129 12/08/2019   Lab Results  Component Value Date   CHOLHDL 4.3 12/08/2019   No results found for: HGBA1C    Assessment & Plan:   Problem List Items Addressed This Visit      Cardiovascular and Mediastinum   Essential hypertension    Pressures have been really well controlled.  I really would like to hold the beta-blocker just to make sure that not causing any of his chest symptoms.  We had actually tried this previously at one point.  But for now we will work on a hold any blood pressure pill for the next couple weeks just to see if it improves.  If he does need to restart something because his blood pressures are going greater than 140 then he can start the valsartan hand.      Relevant Medications   valsartan (DIOVAN) 40 MG tablet    Other Visit Diagnoses    SOB (shortness of breath)    -  Primary   Relevant Medications   albuterol (VENTOLIN HFA) 108 (90 Base) MCG/ACT inhaler   Other Relevant Orders   Pulmonary function test   ECHOCARDIOGRAM COMPLETE   Atypical chest pain       Relevant Orders   ECHOCARDIOGRAM COMPLETE     Shortness of breath-he reports he is the same symptoms he has  been experiencing for months but the description is different than what I had previously documented so I  do feel like this is a bit of a progression and do feel like this needs further work-up.  Again chest x-ray over the summer was normal for similar symptoms.  I think [place] refer him for pulmonary function testing at this point.  He does have a sister who has asthma.  Certainly he could have developed adult onset asthma.  Recommend a trial of albuterol.  Also try switching him off his beta-blocker to a different blood pressure medication just to make sure this is not increasing his shortness of breath.  We actually tried this previously with lisinopril and he actually fell like his symptoms got worse and so he had actually switched him back at that point.  Have him perform PFTs today as well.  Meds ordered this encounter  Medications  . valsartan (DIOVAN) 40 MG tablet    Sig: Take 1 tablet (40 mg total) by mouth daily.    Dispense:  30 tablet    Refill:  0  . albuterol (VENTOLIN HFA) 108 (90 Base) MCG/ACT inhaler    Sig: Inhale 2 puffs into the lungs every 6 (six) hours as needed for wheezing or shortness of breath.    Dispense:  8 g    Refill:  0    Follow-up: No follow-ups on file.    Nani Gasser, MD

## 2020-07-19 NOTE — Patient Instructions (Signed)
Please discontinue the metoprolol in order to try valsartan hand in its place for blood pressure control.  Beta-blockers can cause some chest tightness and rarely shortness of breath so just would not illuminate this as a potential cause.

## 2020-07-19 NOTE — Assessment & Plan Note (Signed)
Pressures have been really well controlled.  I really would like to hold the beta-blocker just to make sure that not causing any of his chest symptoms.  We had actually tried this previously at one point.  But for now we will work on a hold any blood pressure pill for the next couple weeks just to see if it improves.  If he does need to restart something because his blood pressures are going greater than 140 then he can start the valsartan hand.

## 2020-08-10 ENCOUNTER — Other Ambulatory Visit: Payer: Self-pay | Admitting: Family Medicine

## 2020-08-20 ENCOUNTER — Telehealth: Payer: Self-pay

## 2020-08-20 DIAGNOSIS — R0789 Other chest pain: Secondary | ICD-10-CM

## 2020-08-20 DIAGNOSIS — R0602 Shortness of breath: Secondary | ICD-10-CM

## 2020-08-20 NOTE — Telephone Encounter (Signed)
I apologize I think I may have forgotten to actually place the referral when he came in so glad he called.  When had them placed it today please check with Arline Asp to make sure that we get that expedited if at all possible.

## 2020-08-20 NOTE — Telephone Encounter (Signed)
Timothy Shea called and left a message stating he hasn't heard from the referral for his respiratory issues. I don't see a referral.

## 2020-08-22 NOTE — Telephone Encounter (Signed)
Cindy,  Can you check on this for Dr. Linford Arnold.Marland KitchenMarland Kitchen

## 2020-08-22 NOTE — Telephone Encounter (Signed)
Patient given [facility] phone number.

## 2020-09-07 ENCOUNTER — Institutional Professional Consult (permissible substitution): Payer: BC Managed Care – PPO | Admitting: Pulmonary Disease

## 2020-09-14 ENCOUNTER — Ambulatory Visit (HOSPITAL_COMMUNITY)
Admission: RE | Admit: 2020-09-14 | Discharge: 2020-09-14 | Disposition: A | Discharge status: Home / Self Care | Payer: 59 | Source: Ambulatory Visit | Attending: Family Medicine | Admitting: Family Medicine

## 2020-09-14 ENCOUNTER — Other Ambulatory Visit: Payer: Self-pay

## 2020-09-14 DIAGNOSIS — I10 Essential (primary) hypertension: Secondary | ICD-10-CM | POA: Diagnosis not present

## 2020-09-14 DIAGNOSIS — R0789 Other chest pain: Secondary | ICD-10-CM | POA: Diagnosis not present

## 2020-09-14 DIAGNOSIS — R0602 Shortness of breath: Secondary | ICD-10-CM | POA: Diagnosis not present

## 2020-09-14 DIAGNOSIS — I340 Nonrheumatic mitral (valve) insufficiency: Secondary | ICD-10-CM | POA: Insufficient documentation

## 2020-09-14 LAB — ECHOCARDIOGRAM COMPLETE
Area-P 1/2: 2.34 cm2
S' Lateral: 3.4 cm

## 2020-09-14 NOTE — Progress Notes (Signed)
  Echocardiogram 2D Echocardiogram has been performed.  Augustine Radar 09/14/2020, 1:54 PM

## 2020-09-20 ENCOUNTER — Ambulatory Visit (INDEPENDENT_AMBULATORY_CARE_PROVIDER_SITE_OTHER): Payer: BC Managed Care – PPO | Admitting: Pulmonary Disease

## 2020-09-20 ENCOUNTER — Other Ambulatory Visit: Payer: Self-pay

## 2020-09-20 ENCOUNTER — Encounter: Payer: Self-pay | Admitting: Pulmonary Disease

## 2020-09-20 VITALS — BP 120/72 | HR 79 | Temp 97.0°F | Ht 72.0 in | Wt 190.0 lb

## 2020-09-20 DIAGNOSIS — J4520 Mild intermittent asthma, uncomplicated: Secondary | ICD-10-CM

## 2020-09-20 MED ORDER — BREO ELLIPTA 200-25 MCG/INH IN AEPB: 1.0000 | INHALATION_SPRAY | Freq: Every day | RESPIRATORY_TRACT | 0 refills | Status: DC

## 2020-09-20 MED ORDER — BREO ELLIPTA 200-25 MCG/INH IN AEPB: 1.0000 | INHALATION_SPRAY | Freq: Every day | RESPIRATORY_TRACT | 11 refills | Status: DC

## 2020-09-20 NOTE — Patient Instructions (Addendum)
Nice to meet you  Use Breo 1 puff daily  This may help with the sensation to take a deep breath  If it doesn't help, we will get pulmonary function test - please call us if not working  Return to clinic for follow up in 3 months

## 2020-09-26 NOTE — Progress Notes (Signed)
@Patient  ID: Timothy Shea, male    DOB: 05-12-1996, 25 y.o.   MRN: [id number]  Chief Complaint  Patient presents with  . Consult    Referred by PCP for SOB that started about 9 months. Notices the SOB with exertion. SOB went away for a few months but recently returned.     Referring provider: Hali Marry, *  HPI:   25 year old whom we are seeing for evaluation of dyspnea on exertion.  Multiple PCP notes reviewed.  Cardiology notes reviewed.  Notes dyspnea exertion for several months now.  Approaching a year.  He notes exertion has to be pretty intensive.  Mainly with intentional exercise.  Playing basketball.  No issues with normal activities of daily living.  No issues going up stairs inclines etc.  He describes sensation of difficulty getting a deep breath, chest tightness.  Symptoms described as moderate to severe when they occur.  No environmental trigger or changes to symptoms.  No timing during the day with this better or worse.  Does not appear to have changed with change of seasons.  He does endorse some mild seasonal allergies he takes over-the-counter medicine for occasionally.  No alleviating or exacerbating factors.  He did try over-the-counter asthma inhaler that is presumably epinephrine that did not help, caused heart racing, palpitations that he felt.  Chest x-ray from 02/2020 during timeframe of symptoms reviewed and interpreted as clear lungs, no infiltrate, effusion, pneumothorax.  He had a TTE during cardiology work-up which on my review demonstrates normal findings.  PMH: Hypertension Surgical history reviewed, he denies any surgical history Family history: Denies respiratory issues in first-degree relatives Social history: Never smoker, employed, lives in [facility] / Pulmonary Flowsheets:   ACT:  No flowsheet data found.  MMRC: mMRC Dyspnea Scale mMRC Score  09/20/2020 0    Epworth:  No flowsheet data found.  Tests:   FENO:   No results found for: NITRICOXIDE  PFT: No flowsheet data found.  WALK:  No flowsheet data found.  Imaging: Personally reviewed and as per EMR discussion of this note ECHOCARDIOGRAM COMPLETE  Result Date: 09/14/2020    ECHOCARDIOGRAM REPORT   Patient Name:   Timothy Shea Date of Exam: 09/14/2020 Medical Rec #:  [id number]        Height:       72.0 in Accession #:    [id number]       Weight:       187.0 lb Date of Birth:  24-Jul-1996         BSA:          2.071 m Patient Age:    25 years         BP:           137/88 mmHg Patient Gender: M                HR:           64 bpm. Exam Location:  Outpatient Procedure: 2D Echo, Color Doppler and Cardiac Doppler Indications:    R07.9* Chest pain, unspecified  History:        Patient has no prior history of Echocardiogram examinations.                 Signs/Symptoms:Chest Pain; Risk Factors:Hypertension.  Sonographer:    Bernadene Person RDCS Referring Phys: [id number] CATHERINE D METHENEY IMPRESSIONS  1. Left ventricular ejection fraction, by estimation, is 55 to 60%. The left ventricle has normal function. The  left ventricle has no regional wall motion abnormalities. Left ventricular diastolic parameters were normal.  2. Right ventricular systolic function is normal. The right ventricular size is normal.  3. The mitral valve is normal in structure. Trivial mitral valve regurgitation. No evidence of mitral stenosis.  4. The aortic valve is normal in structure. Aortic valve regurgitation is not visualized. No aortic stenosis is present.  5. The inferior vena cava is normal in size with greater than 50% respiratory variability, suggesting right atrial pressure of 3 mmHg. FINDINGS  Left Ventricle: Left ventricular ejection fraction, by estimation, is 55 to 60%. The left ventricle has normal function. The left ventricle has no regional wall motion abnormalities. The left ventricular internal cavity size was normal in size. There is  no left ventricular hypertrophy. Left  ventricular diastolic parameters were normal. Normal left ventricular filling pressure. Right Ventricle: The right ventricular size is normal. No increase in right ventricular wall thickness. Right ventricular systolic function is normal. Left Atrium: Left atrial size was normal in size. Right Atrium: Right atrial size was normal in size. Pericardium: There is no evidence of pericardial effusion. Mitral Valve: The mitral valve is normal in structure. Trivial mitral valve regurgitation. No evidence of mitral valve stenosis. Tricuspid Valve: The tricuspid valve is normal in structure. Tricuspid valve regurgitation is not demonstrated. No evidence of tricuspid stenosis. Aortic Valve: The aortic valve is normal in structure. Aortic valve regurgitation is not visualized. No aortic stenosis is present. Pulmonic Valve: The pulmonic valve was normal in structure. Pulmonic valve regurgitation is not visualized. No evidence of pulmonic stenosis. Aorta: The aortic root is normal in size and structure. Venous: The inferior vena cava is normal in size with greater than 50% respiratory variability, suggesting right atrial pressure of 3 mmHg. IAS/Shunts: No atrial level shunt detected by color flow Doppler.  LEFT VENTRICLE PLAX 2D LVIDd:         4.60 cm  Diastology LVIDs:         3.40 cm  LV e' medial:    11.20 cm/s LV PW:         0.90 cm  LV E/e' medial:  7.0 LV IVS:        0.80 cm  LV e' lateral:   16.50 cm/s LVOT diam:     1.80 cm  LV E/e' lateral: 4.7 LV SV:         57 LV SV Index:   28 LVOT Area:     2.54 cm  RIGHT VENTRICLE RV S prime:     13.50 cm/s TAPSE (M-mode): 2.3 cm LEFT ATRIUM             Index       RIGHT ATRIUM           Index LA diam:        3.10 cm 1.50 cm/m  RA Area:     12.60 cm LA Vol (A2C):   39.4 ml 19.03 ml/m RA Volume:   29.70 ml  14.34 ml/m LA Vol (A4C):   35.8 ml 17.29 ml/m LA Biplane Vol: 39.7 ml 19.17 ml/m  AORTIC VALVE LVOT Vmax:   113.00 cm/s LVOT Vmean:  80.100 cm/s LVOT VTI:    0.225 m   AORTA Ao Root diam: 2.80 cm Ao Asc diam:  2.50 cm MITRAL VALVE MV Area (PHT): 2.34 cm    SHUNTS MV Decel Time: 324 msec    Systemic VTI:  0.22 m MV E velocity: 78.10 cm/s  Systemic  Diam: 1.80 cm MV A velocity: 40.70 cm/s MV E/A ratio:  1.92 Mihai Croitoru MD Electronically signed by Sanda Klein MD Signature Date/Time: 09/14/2020/3:22:04 PM    Final     Lab Results: Personally reviewed, most recent CBC in 2016 with a eos of 200 CBC    Component Value Date/Time   WBC 6.0 03/29/2015 1612   RBC 5.15 03/29/2015 1612   HGB 14.8 03/29/2015 1612   HCT 43.8 03/29/2015 1612   PLT 229 03/29/2015 1612   MCV 85.0 03/29/2015 1612   MCH 28.7 03/29/2015 1612   MCHC 33.8 03/29/2015 1612   RDW 13.5 03/29/2015 1612   LYMPHSABS 1.7 03/29/2015 1612   MONOABS 0.5 03/29/2015 1612   EOSABS 0.2 03/29/2015 1612   BASOSABS 0.1 03/29/2015 1612    BMET    Component Value Date/Time   NA 138 12/28/2019 0900   K 4.7 12/28/2019 0900   CL 104 12/28/2019 0900   CO2 25 12/28/2019 0900   GLUCOSE 96 12/28/2019 0900   BUN 13 12/28/2019 0900   CREATININE 0.93 12/28/2019 0900   CALCIUM 9.7 12/28/2019 0900   GFRNONAA 114 12/28/2019 0900   GFRAA 132 12/28/2019 0900    BNP No results found for: BNP  ProBNP No results found for: PROBNP  Specialty Problems   None     No Known Allergies  Immunization History  Administered Date(s) Administered  . DTP 12/17/1994, 02/16/1995, 04/27/1995, 01/13/1996, 10/15/1998  . HPV Quadrivalent 02/03/2013  . Hepatitis A 02/03/2013  . Hepatitis B Feb 26, 1995, 12/17/1994, 04/27/1995  . Influenza,inj,Quad PF,6+ Mos 07/06/2018, 06/28/2020  . Influenza-Unspecified 07/01/2017  . Janssen (J&J) SARS-COV-2 Vaccination 11/09/2019  . MMR 01/13/1996, 10/15/1998  . Meningococcal Conjugate 02/03/2013  . Meningococcal Polysaccharide 02/25/2007  . OPV 12/17/1994, 01/13/1996, 02/16/1996, 10/15/1998  . Tdap 03/20/2009, 01/18/2016  . Varicella 02/03/2013    Past Medical History:   Diagnosis Date  . Essential hypertension 08/31/2012    Tobacco History: Social History   Tobacco Use  Smoking Status Never Smoker  Smokeless Tobacco Never Used   Counseling given: Not Answered   Continue to not smoke  Outpatient Encounter Medications as of 09/20/2020  Medication Sig  . albuterol (VENTOLIN HFA) 108 (90 Base) MCG/ACT inhaler Inhale 2 puffs into the lungs every 6 (six) hours as needed for wheezing or shortness of breath.  Marland Kitchen atenolol (TENORMIN) 25 MG tablet Take 1 tablet (25 mg total) by mouth daily.  . fluticasone furoate-vilanterol (BREO ELLIPTA) 200-25 MCG/INH AEPB Inhale 1 puff into the lungs daily.  . fluticasone furoate-vilanterol (BREO ELLIPTA) 200-25 MCG/INH AEPB Inhale 1 puff into the lungs daily.  . valsartan (DIOVAN) 40 MG tablet TAKE 1 TABLET BY MOUTH EVERY DAY   No facility-administered encounter medications on file as of 09/20/2020.     Review of Systems  Review of Systems  No chest pain with exertion.  No orthopnea or PND.  Comprehensive review of systems otherwise negative. Physical Exam  BP 120/72   Pulse 79   Temp (!) 97 F (36.1 C) (Temporal)   Ht 6' (1.829 m)   Wt 190 lb (86.2 kg)   SpO2 99% Comment: on RA  BMI 25.77 kg/m   Wt Readings from Last 5 Encounters:  09/20/20 190 lb (86.2 kg)  07/19/20 187 lb (84.8 kg)  06/28/20 183 lb (83 kg)  03/02/20 185 lb (83.9 kg)  12/28/19 183 lb (83 kg)    BMI Readings from Last 5 Encounters:  09/20/20 25.77 kg/m  07/19/20 25.36 kg/m  06/28/20  24.82 kg/m  03/02/20 25.09 kg/m  12/28/19 24.82 kg/m     Physical Exam General: Sitting in chair, no acute distress Eyes: EOMI, no icterus Neck: Supple, no JVP Respiratory: Clear to oxygen bilaterally no wheeze or crackle Cardiovascular: Regular rate and rhythm, no murmurs Abdomen: Nondistended, bowel sounds present MSK: No synovitis, no joint effusion Neuro: Normal gait, no weakness Psych: Normal mood, full affect   Assessment &  Plan:   DOE: with exercise. No DOE with daily activities, stairs, etc. Possible exercise induced asthma. Cardiac workup reassuring as expected given age, lack of co-morbidities.  Exercise induiced asthma: Chest tightness, DOE with extreme exertion. CXR 02/2020 clear. Breo daily. Assess response. Consider phenotyping in future.   Return in about 3 months (around 12/18/2020).   Lanier Clam, MD 09/26/2020

## 2020-10-10 ENCOUNTER — Telehealth: Payer: Self-pay | Admitting: Pulmonary Disease

## 2020-10-10 DIAGNOSIS — J4520 Mild intermittent asthma, uncomplicated: Secondary | ICD-10-CM

## 2020-10-10 NOTE — Telephone Encounter (Signed)
Spoke with the pt  He states has not noticed any improvement of Breo  He has not had any new symptoms or worsening symptoms  I advised per last ov AVS would need PFT done if this was the case  Pt aware and he states prefers to call his insurance before scheduling so that he can find out the cost  He will call us back to schedule  I went ahead and placed order for PFT

## 2020-12-26 ENCOUNTER — Ambulatory Visit: Payer: BC Managed Care – PPO | Admitting: Family Medicine

## 2021-04-17 ENCOUNTER — Other Ambulatory Visit: Payer: Self-pay | Admitting: Family Medicine

## 2021-04-17 DIAGNOSIS — I10 Essential (primary) hypertension: Secondary | ICD-10-CM

## 2021-04-17 NOTE — Telephone Encounter (Signed)
Call pt he will need f/u for bp 30 day refill sent.

## 2021-04-17 NOTE — Telephone Encounter (Signed)
Patient has an appt scheduled for Sept 19th. AM

## 2021-04-29 ENCOUNTER — Ambulatory Visit: Payer: 59 | Admitting: Family Medicine

## 2021-05-14 ENCOUNTER — Other Ambulatory Visit: Payer: Self-pay | Admitting: Family Medicine

## 2021-05-14 DIAGNOSIS — I10 Essential (primary) hypertension: Secondary | ICD-10-CM

## 2021-05-16 ENCOUNTER — Ambulatory Visit (INDEPENDENT_AMBULATORY_CARE_PROVIDER_SITE_OTHER): Payer: BC Managed Care – PPO | Admitting: Family Medicine

## 2021-05-16 ENCOUNTER — Other Ambulatory Visit: Payer: Self-pay

## 2021-05-16 ENCOUNTER — Encounter: Payer: Self-pay | Admitting: Family Medicine

## 2021-05-16 VITALS — BP 134/57 | HR 55 | Ht 72.0 in | Wt 186.0 lb

## 2021-05-16 DIAGNOSIS — R3589 Other polyuria: Secondary | ICD-10-CM | POA: Insufficient documentation

## 2021-05-16 DIAGNOSIS — Z23 Encounter for immunization: Secondary | ICD-10-CM

## 2021-05-16 DIAGNOSIS — R631 Polydipsia: Secondary | ICD-10-CM

## 2021-05-16 DIAGNOSIS — E782 Mixed hyperlipidemia: Secondary | ICD-10-CM

## 2021-05-16 DIAGNOSIS — I10 Essential (primary) hypertension: Secondary | ICD-10-CM

## 2021-05-16 NOTE — Progress Notes (Signed)
Established Patient Office Visit  Subjective:  Patient ID: Timothy Shea, male    DOB: 1995/08/26  Age: 25 y.o. MRN: [id number]  CC:  Chief Complaint  Patient presents with   Hypertension    HPI [name] presents for   Hypertension- Pt denies chest pain, SOB, dizziness, or heart palpitations.  Taking meds as directed w/o problems.  Denies medication side effects.    He did mention that he feels like he urinates a lot but he also constantly feels thirsty he says he never really feels completely satiated.  No prior history of diabetes.   Past Medical History:  Diagnosis Date   Essential hypertension 08/31/2012    No past surgical history on file.  Family History  Problem Relation Age of Onset   Hypertension Mother    Depression Mother    Hypothyroidism Mother     Social History   Socioeconomic History   Marital status: Single    Spouse name: Not on file   Number of children: Not on file   Years of education: Not on file   Highest education level: Not on file  Occupational History   Not on file  Tobacco Use   Smoking status: Never   Smokeless tobacco: Never  Vaping Use   Vaping Use: Never used  Substance and Sexual Activity   Alcohol use: Yes    Comment: 1 drink per week   Drug use: No   Sexual activity: Not on file  Other Topics Concern   Not on file  Social History Narrative   Not on file   Social Determinants of Health   Financial Resource Strain: Not on file  Food Insecurity: Not on file  Transportation Needs: Not on file  Physical Activity: Not on file  Stress: Not on file  Social Connections: Not on file  Intimate Partner Violence: Not on file    Outpatient Medications Prior to Visit  Medication Sig Dispense Refill   atenolol (TENORMIN) 25 MG tablet Take 1 tablet (25 mg total) by mouth daily. 90 tablet 0   albuterol (VENTOLIN HFA) 108 (90 Base) MCG/ACT inhaler Inhale 2 puffs into the lungs every 6 (six) hours as needed for wheezing  or shortness of breath. 8 g 0   fluticasone furoate-vilanterol (BREO ELLIPTA) 200-25 MCG/INH AEPB Inhale 1 puff into the lungs daily. 60 each 11   fluticasone furoate-vilanterol (BREO ELLIPTA) 200-25 MCG/INH AEPB Inhale 1 puff into the lungs daily. 1 each 0   valsartan (DIOVAN) 40 MG tablet TAKE 1 TABLET BY MOUTH EVERY DAY 90 tablet 0   No facility-administered medications prior to visit.    No Known Allergies  ROS Review of Systems    Objective:    Physical Exam Constitutional:      Appearance: Normal appearance. He is well-developed.  HENT:     Head: Normocephalic and atraumatic.  Cardiovascular:     Rate and Rhythm: Normal rate and regular rhythm.     Heart sounds: Normal heart sounds.  Pulmonary:     Effort: Pulmonary effort is normal.     Breath sounds: Normal breath sounds.  Skin:    General: Skin is warm and dry.  Neurological:     Mental Status: He is alert and oriented to person, place, and time. Mental status is at baseline.  Psychiatric:        Behavior: Behavior normal.    BP (!) 134/57   Pulse (!) 55   Ht 6' (1.829 m)   [employer]  186 lb (84.4 kg)   SpO2 100%   BMI 25.23 kg/m  Wt Readings from Last 3 Encounters:  05/16/21 186 lb (84.4 kg)  09/20/20 190 lb (86.2 kg)  07/19/20 187 lb (84.8 kg)     Health Maintenance Due  Topic Date Due   HPV VACCINES (2 - Male 3-dose series) 03/03/2013   COVID-19 Vaccine (2 - Booster for Janssen series) 01/04/2020       Topic Date Due   HPV VACCINES (2 - Male 3-dose series) 03/03/2013    Lab Results  Component Value Date   TSH 3.08 12/28/2019   Lab Results  Component Value Date   WBC 6.0 03/29/2015   HGB 14.8 03/29/2015   HCT 43.8 03/29/2015   MCV 85.0 03/29/2015   PLT 229 03/29/2015   Lab Results  Component Value Date   NA 138 12/28/2019   K 4.7 12/28/2019   CO2 25 12/28/2019   GLUCOSE 96 12/28/2019   BUN 13 12/28/2019   CREATININE 0.93 12/28/2019   BILITOT 0.5 12/08/2019   ALKPHOS 73 01/18/2016    AST 17 12/08/2019   ALT 18 12/08/2019   PROT 6.8 12/08/2019   ALBUMIN 4.3 01/18/2016   CALCIUM 9.7 12/28/2019   Lab Results  Component Value Date   CHOL 182 12/08/2019   Lab Results  Component Value Date   HDL 42 12/08/2019   Lab Results  Component Value Date   LDLCALC 116 (H) 12/08/2019   Lab Results  Component Value Date   TRIG 129 12/08/2019   Lab Results  Component Value Date   CHOLHDL 4.3 12/08/2019   No results found for: HGBA1C    Assessment & Plan:   Problem List Items Addressed This Visit       Cardiovascular and Mediastinum   Essential hypertension    Blood pressure is a little borderline today.  Doing well on atenolol.  No concerns or problems.  We will make sure refills are sent to the pharmacy he is overdue for updated blood work so did encourage him to go at his convenience he says he recently changed jobs and was just waiting for his new insurance card.      Relevant Orders   Lipid Panel w/reflex Direct LDL   COMPLETE METABOLIC PANEL WITH GFR   CBC   Hemoglobin A1c   TSH     Other   Polyuria   Relevant Orders   Urinalysis, Routine w reflex microscopic   Osmolality, urine   Osmolality   Copeptin   Increased thirst    Polyuria and polydipsia.  May be benign.  We will start to by evaluating for thyroid disease and diabetes we will also check sodium levels and check urinalysis.  We will also consider other diagnoses such as diabetes insipidus.  We will check echo peptide level.  We will call with results once available.      Relevant Orders   Lipid Panel w/reflex Direct LDL   COMPLETE METABOLIC PANEL WITH GFR   CBC   Hemoglobin A1c   TSH   Osmolality, urine   Osmolality   Copeptin   Hyperlipidemia - Primary   Relevant Orders   Lipid Panel w/reflex Direct LDL   COMPLETE METABOLIC PANEL WITH GFR   CBC   Hemoglobin A1c   TSH   Other Visit Diagnoses     Need for immunization against influenza       Relevant Orders   Flu Vaccine QUAD  42mo+IM (Fluarix, Fluzone & Alfiuria  Quad PF) (Completed)       Flu vaccine given today.  No orders of the defined types were placed in this encounter.   Follow-up: Return in about 1 year (around 05/16/2022) for Hypertension.    Nani Gasser, MD

## 2021-05-16 NOTE — Assessment & Plan Note (Signed)
Blood pressure is a little borderline today.  Doing well on atenolol.  No concerns or problems.  We will make sure refills are sent to the pharmacy he is overdue for updated blood work so did encourage him to go at his convenience he says he recently changed jobs and was just waiting for his new insurance card.

## 2021-05-16 NOTE — Assessment & Plan Note (Signed)
Polyuria and polydipsia.  May be benign.  We will start to by evaluating for thyroid disease and diabetes we will also check sodium levels and check urinalysis.  We will also consider other diagnoses such as diabetes insipidus.  We will check echo peptide level.  We will call with results once available.

## 2021-07-01 DIAGNOSIS — I10 Essential (primary) hypertension: Secondary | ICD-10-CM | POA: Diagnosis not present

## 2021-07-01 DIAGNOSIS — R3589 Other polyuria: Secondary | ICD-10-CM | POA: Diagnosis not present

## 2021-07-01 DIAGNOSIS — E782 Mixed hyperlipidemia: Secondary | ICD-10-CM | POA: Diagnosis not present

## 2021-07-01 DIAGNOSIS — R631 Polydipsia: Secondary | ICD-10-CM | POA: Diagnosis not present

## 2021-07-02 NOTE — Progress Notes (Signed)
Call patient: LDL just mildly elevated similar to last year but much better than 2 years ago.  Metabolic panel looks good.  Blood counts normal no sign of anemia.  No diabetes.  Thyroid looks great.  Analysis is also normal.  No abnormal findings in the urine sample.  Nothing specific to explain his symptoms.  If he still having a lot of urinary frequency we could always refer him to urology for further evaluation for something more anatomical.

## 2021-07-03 LAB — URINALYSIS, ROUTINE W REFLEX MICROSCOPIC
Bilirubin Urine: NEGATIVE
Glucose, UA: NEGATIVE
Hgb urine dipstick: NEGATIVE
Ketones, ur: NEGATIVE
Leukocytes,Ua: NEGATIVE
Nitrite: NEGATIVE
Protein, ur: NEGATIVE
Specific Gravity, Urine: 1.003 (ref 1.001–1.035)
pH: 7.5 (ref 5.0–8.0)

## 2021-07-03 LAB — COMPLETE METABOLIC PANEL WITH GFR
AG Ratio: 1.6 (calc) (ref 1.0–2.5)
ALT: 20 U/L (ref 9–46)
AST: 17 U/L (ref 10–40)
Albumin: 4.5 g/dL (ref 3.6–5.1)
Alkaline phosphatase (APISO): 70 U/L (ref 36–130)
BUN: 13 mg/dL (ref 7–25)
CO2: 28 mmol/L (ref 20–32)
Calcium: 9.6 mg/dL (ref 8.6–10.3)
Chloride: 106 mmol/L (ref 98–110)
Creat: 0.98 mg/dL (ref 0.60–1.24)
Globulin: 2.8 g/dL (calc) (ref 1.9–3.7)
Glucose, Bld: 95 mg/dL (ref 65–99)
Potassium: 4.7 mmol/L (ref 3.5–5.3)
Sodium: 139 mmol/L (ref 135–146)
Total Bilirubin: 0.9 mg/dL (ref 0.2–1.2)
Total Protein: 7.3 g/dL (ref 6.1–8.1)
eGFR: 109 mL/min/{1.73_m2} (ref >=60)

## 2021-07-03 LAB — TSH: TSH: 1.96 mIU/L (ref 0.40–4.50)

## 2021-07-03 LAB — LIPID PANEL W/REFLEX DIRECT LDL
Cholesterol: 192 mg/dL (ref <200)
HDL: 48 mg/dL (ref >=40)
LDL Cholesterol (Calc): 125 mg/dL (calc) — ABNORMAL HIGH
Non-HDL Cholesterol (Calc): 144 mg/dL (calc) — ABNORMAL HIGH (ref <130)
Total CHOL/HDL Ratio: 4 (calc) (ref <5.0)
Triglycerides: 91 mg/dL (ref <150)

## 2021-07-03 LAB — CBC
HCT: 47 % (ref 38.5–50.0)
Hemoglobin: 16 g/dL (ref 13.2–17.1)
MCH: 29.5 pg (ref 27.0–33.0)
MCHC: 34 g/dL (ref 32.0–36.0)
MCV: 86.6 fL (ref 80.0–100.0)
MPV: 10.1 fL (ref 7.5–12.5)
Platelets: 252 10*3/uL (ref 140–400)
RBC: 5.43 10*6/uL (ref 4.20–5.80)
RDW: 13 % (ref 11.0–15.0)
WBC: 4.4 10*3/uL (ref 3.8–10.8)

## 2021-07-03 LAB — OSMOLALITY, URINE: Osmolality, Ur: 116 mOsm/kg (ref 50–1200)

## 2021-07-03 LAB — HEMOGLOBIN A1C
Hgb A1c MFr Bld: 5.4 % of total Hgb (ref <5.7)
Mean Plasma Glucose: 108 mg/dL
eAG (mmol/L): 6 mmol/L

## 2021-07-03 LAB — OSMOLALITY: Osmolality: 293 mOsm/kg (ref 278–305)

## 2021-07-10 LAB — COPEPTIN: Copeptin: 2.3 pmol/L (ref <=13.7)

## 2021-07-11 NOTE — Progress Notes (Signed)
Your lab work is within acceptable range and there are no concerning findings.   ?

## 2021-08-21 DIAGNOSIS — Z03818 Encounter for observation for suspected exposure to other biological agents ruled out: Secondary | ICD-10-CM | POA: Diagnosis not present

## 2021-08-21 DIAGNOSIS — Z20822 Contact with and (suspected) exposure to covid-19: Secondary | ICD-10-CM | POA: Diagnosis not present

## 2021-08-22 ENCOUNTER — Other Ambulatory Visit: Payer: Self-pay | Admitting: Family Medicine

## 2021-08-22 DIAGNOSIS — I10 Essential (primary) hypertension: Secondary | ICD-10-CM

## 2021-09-27 IMAGING — DX DG CHEST 2V
2 series · 2 of 2 positions shown · non-contrast
Comparison: 03/29/2015

CLINICAL DATA: Chest pain

EXAM:
CHEST - 2 VIEW

[chest pa]
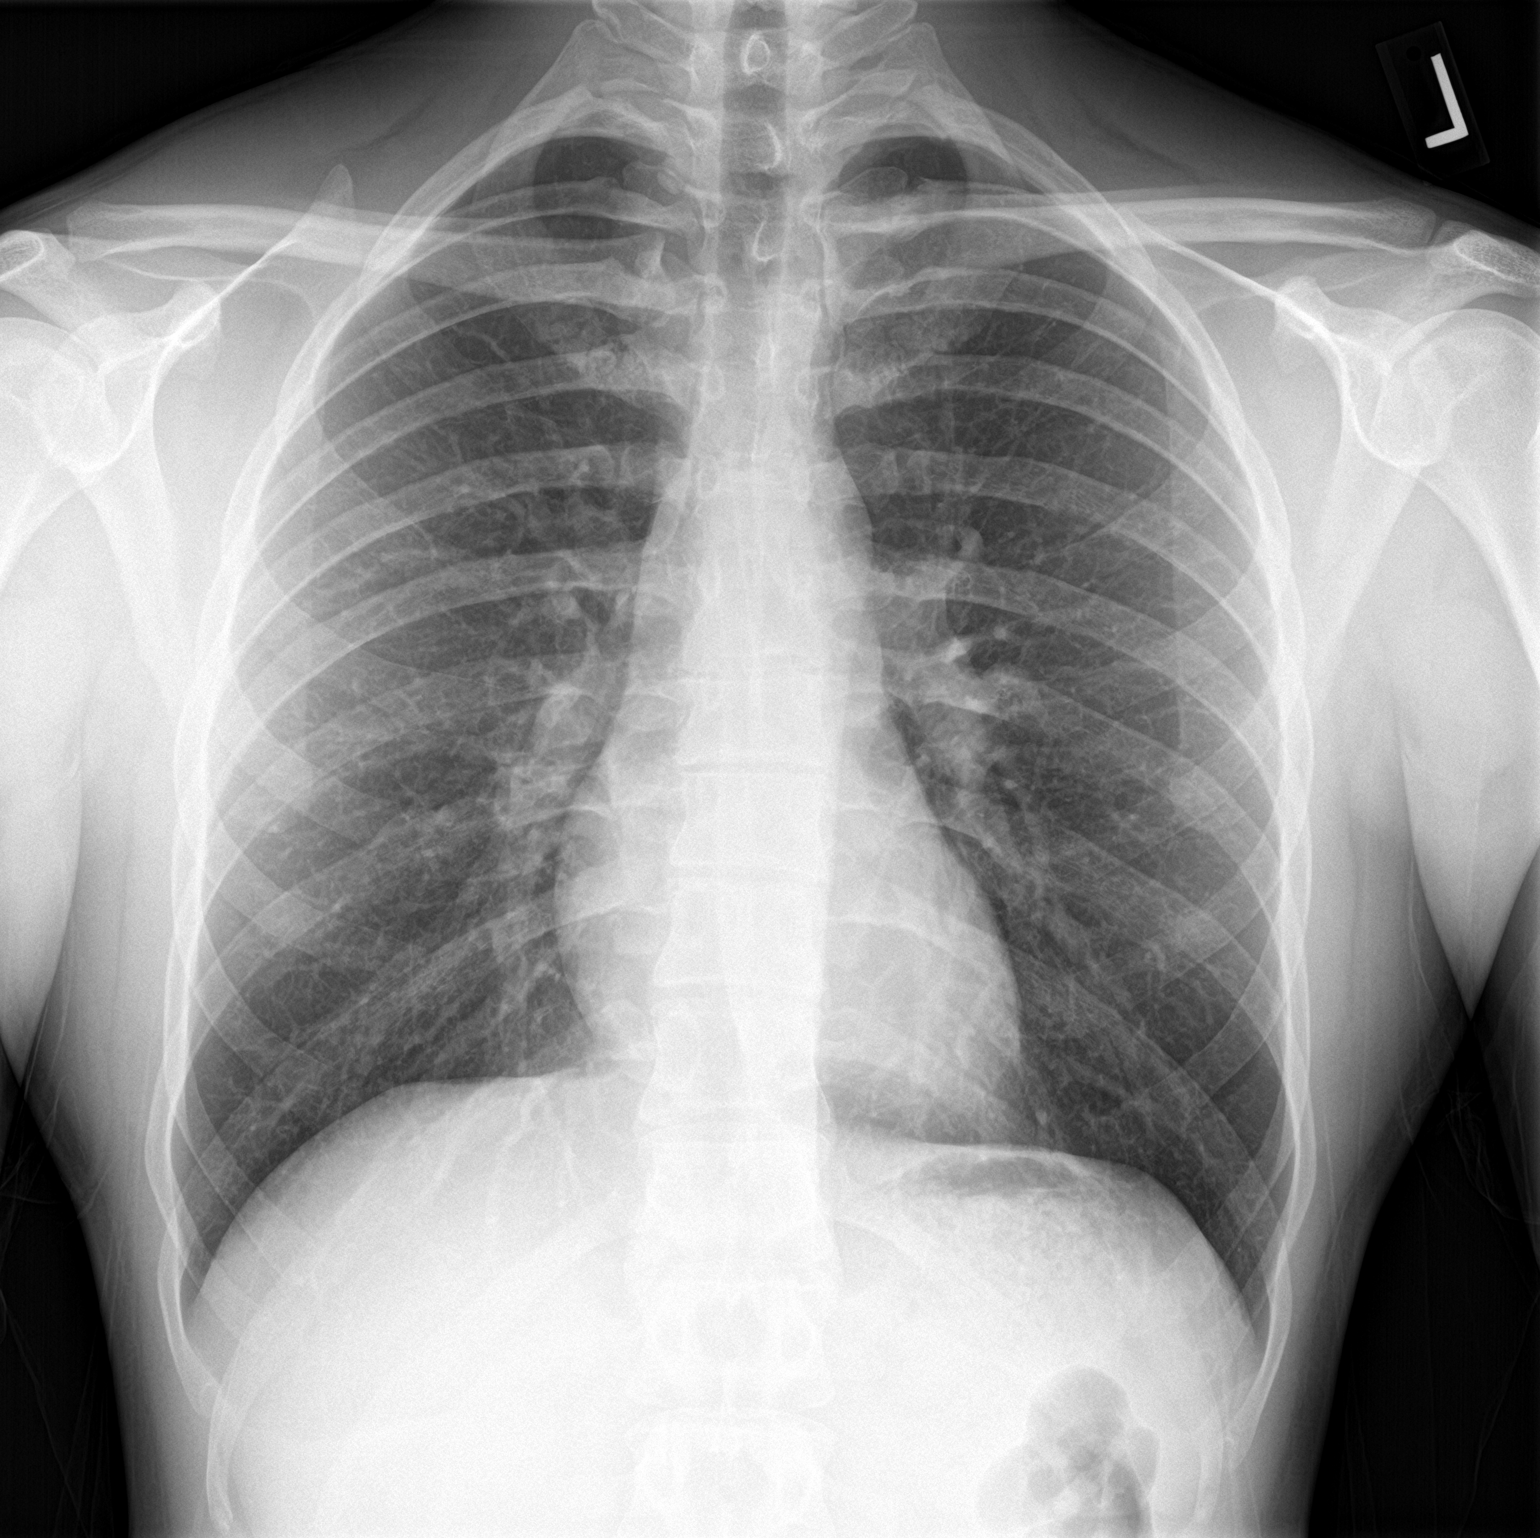

[chest lat]
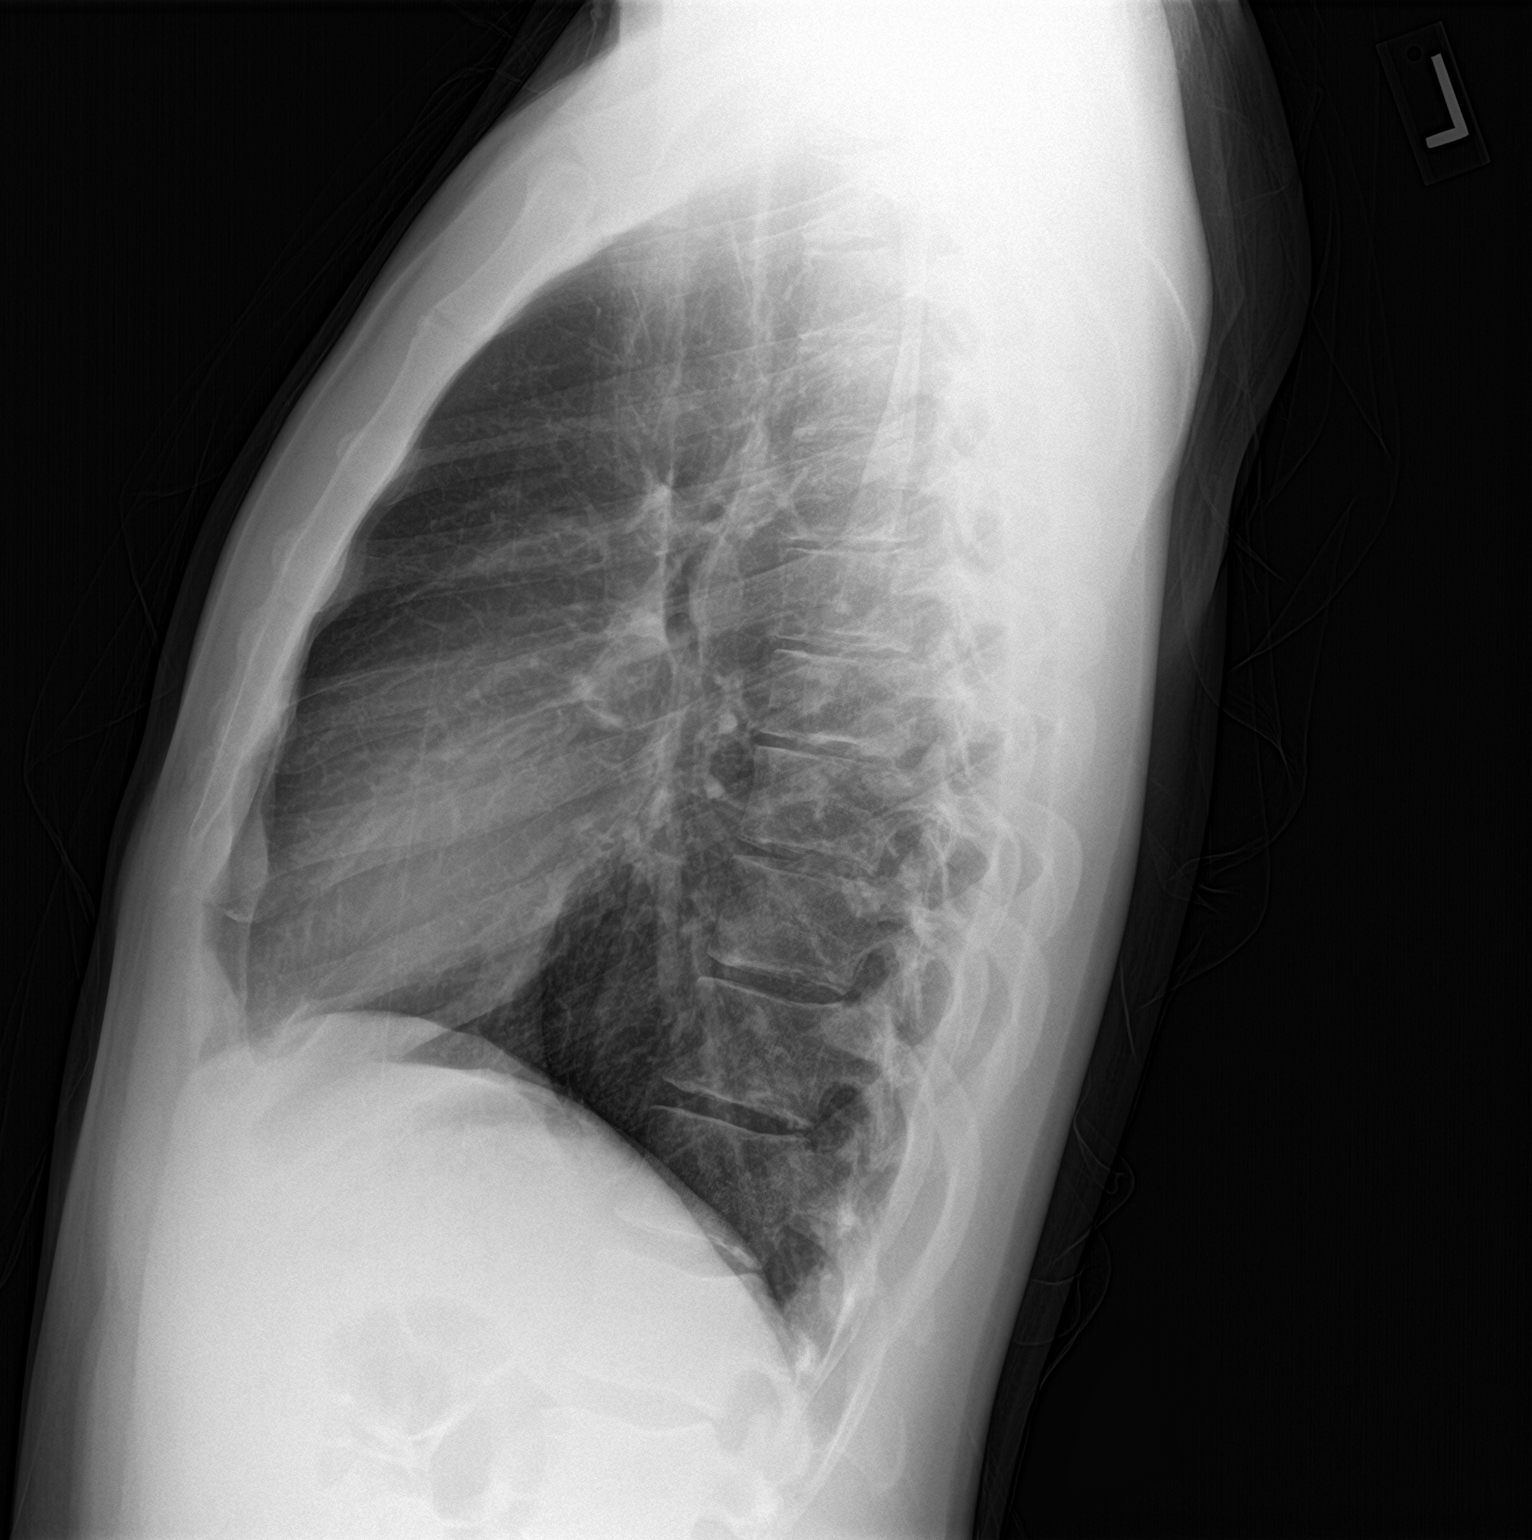

[2 of 2 positions shown; findings below may reference images not displayed]

FINDINGS: The heart size and mediastinal contours are within normal limits.
Both lungs are clear. The visualized skeletal structures are
unremarkable.
IMPRESSION: Negative.

## 2021-10-04 IMAGING — US US ABDOMEN COMPLETE
2 series · 14 of 25 positions shown · non-contrast
Comparison: None.

CLINICAL DATA: Intermittent epigastric pain for 4 months.

EXAM:
ABDOMEN ULTRASOUND COMPLETE

[Series 1: us abdomen complete · 0.08mm/px · 13 of 151 slices shown (1 of 2)]
[im 1/151]
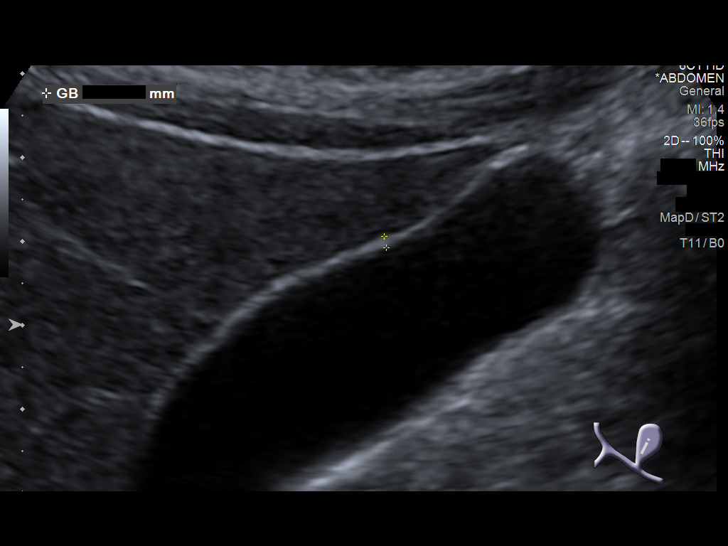
[im 14/151]
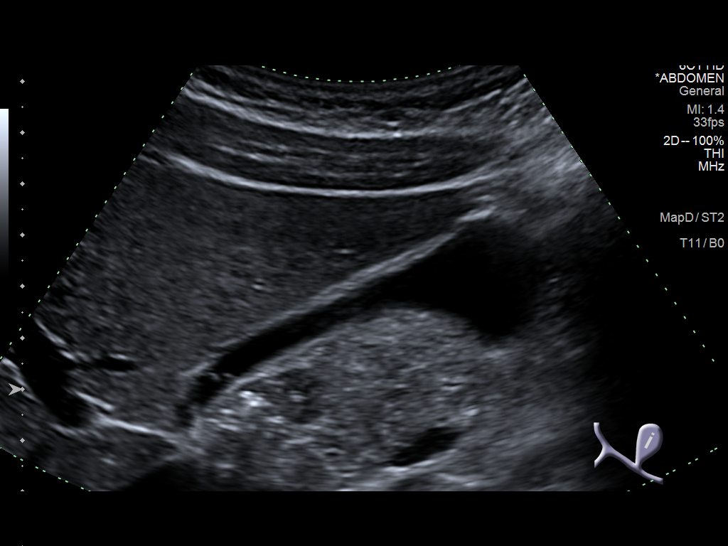
[im 27/151]
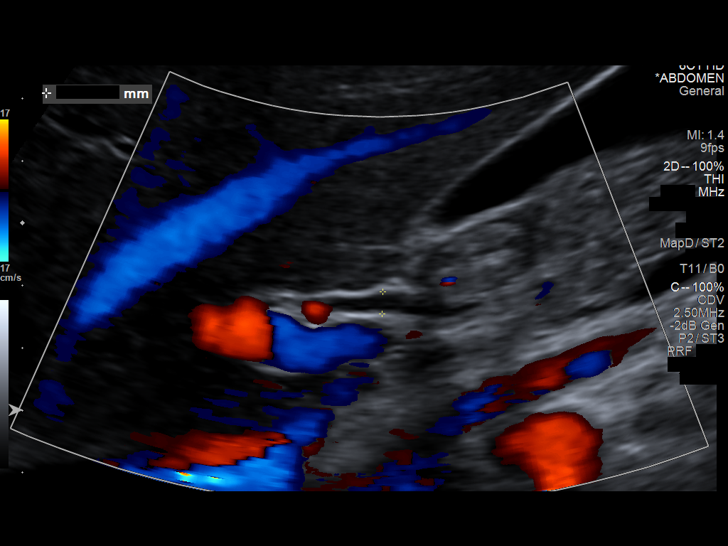
[im 40/151]
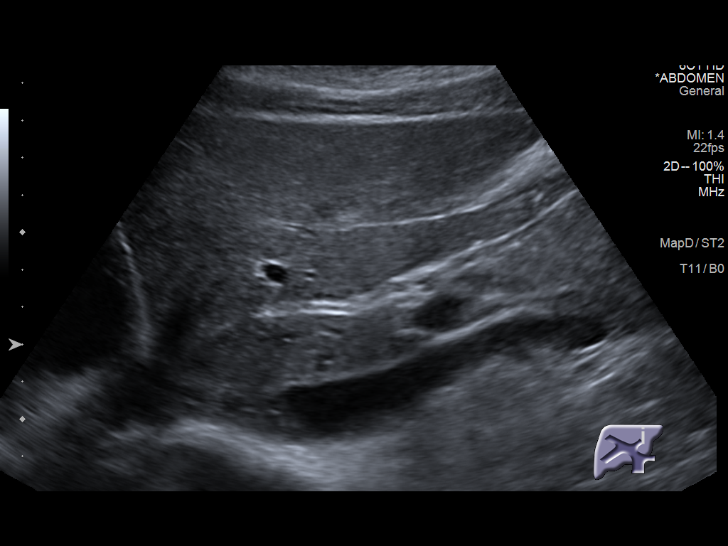
[im 53/151]
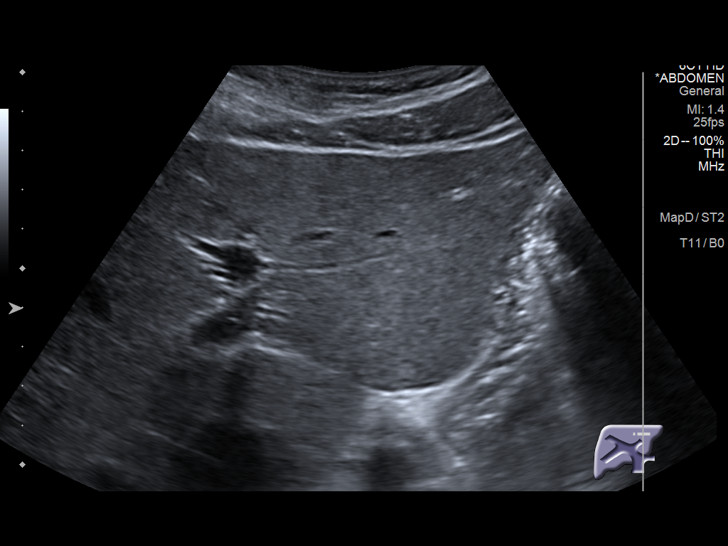
[im 59/151]
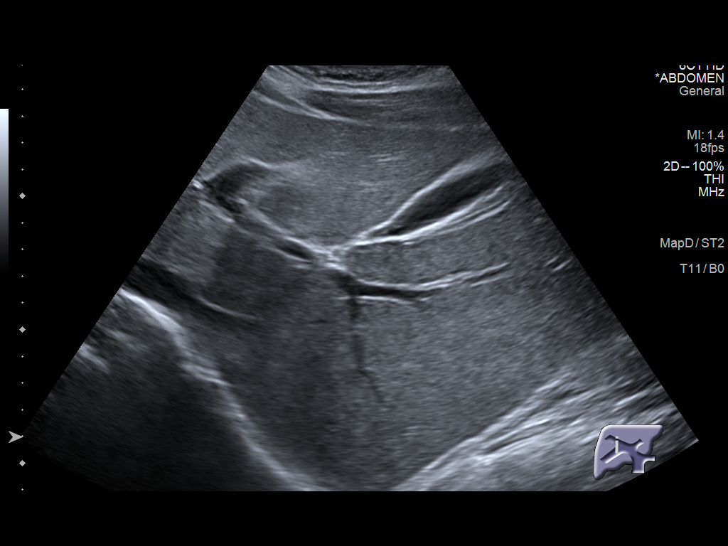
[im 72/151]
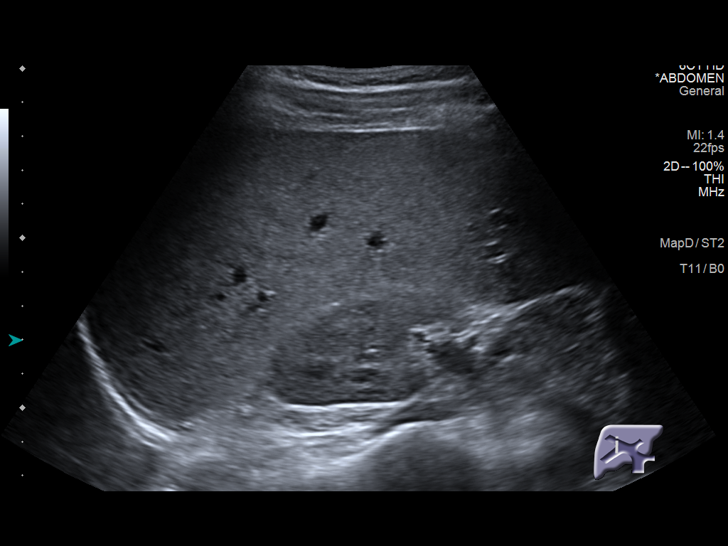
[im 85/151]
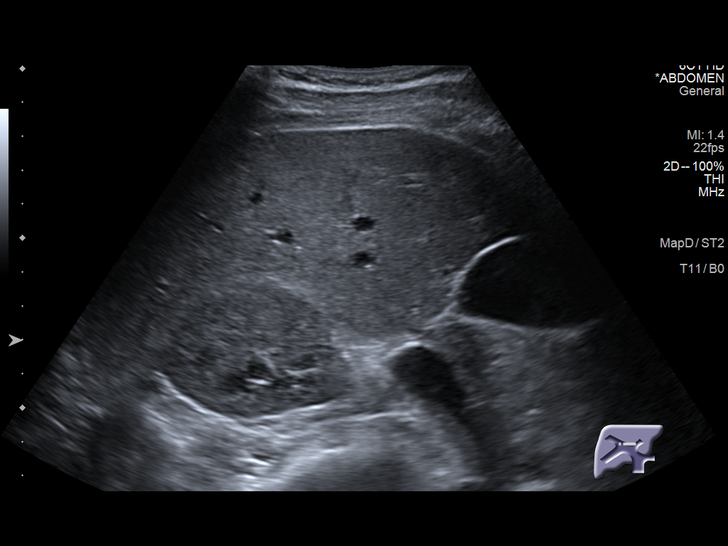
[im 98/151]
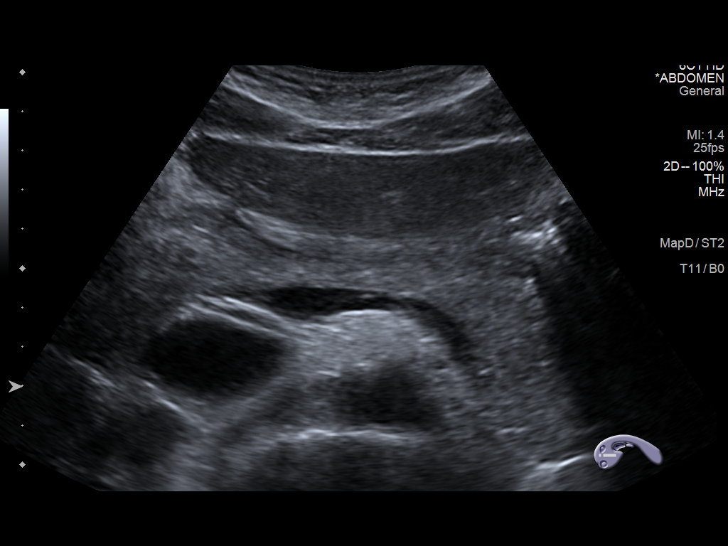
[im 105/151]
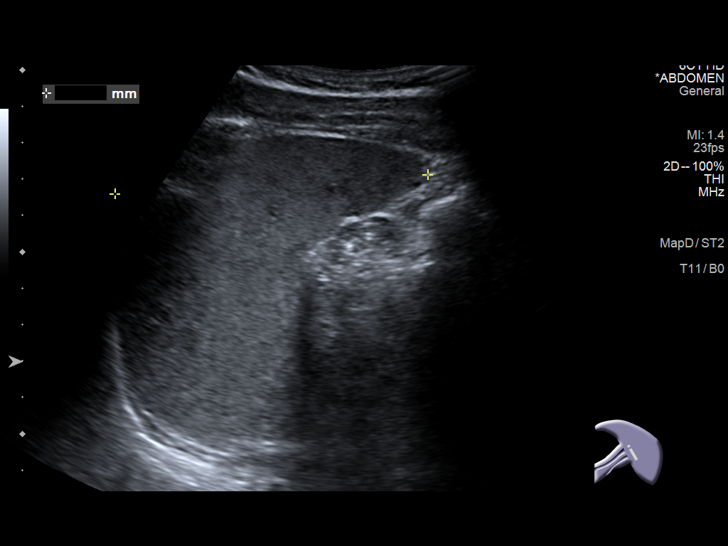
[im 118/151]
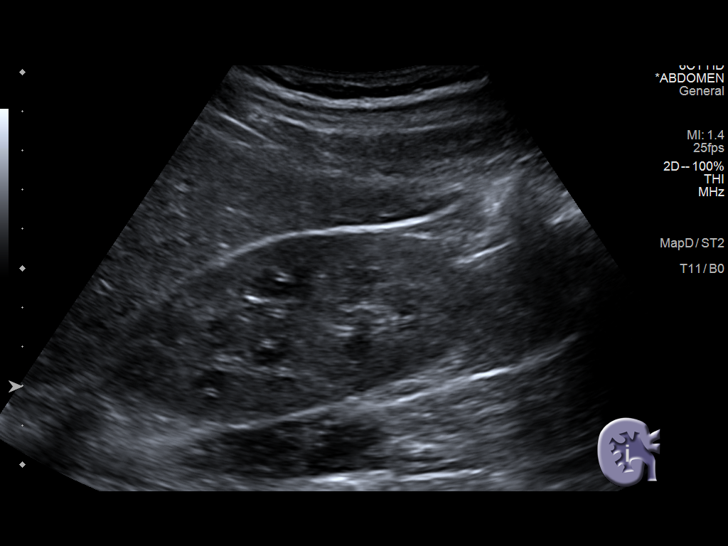
[im 131/151]
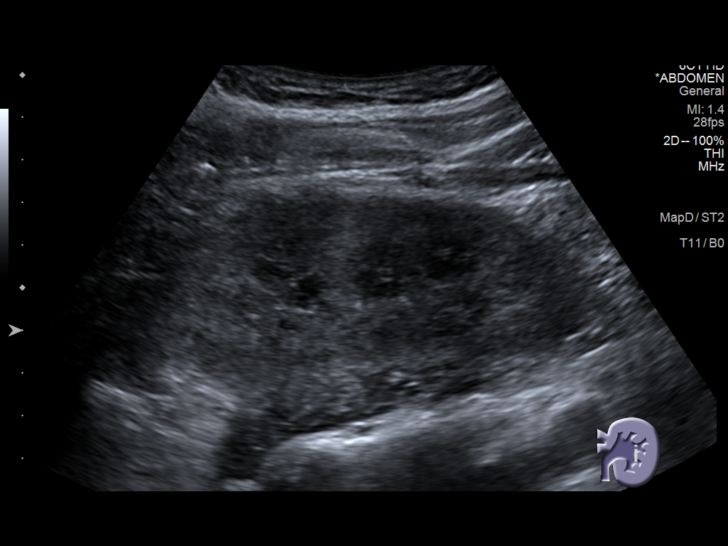
[im 144/151]
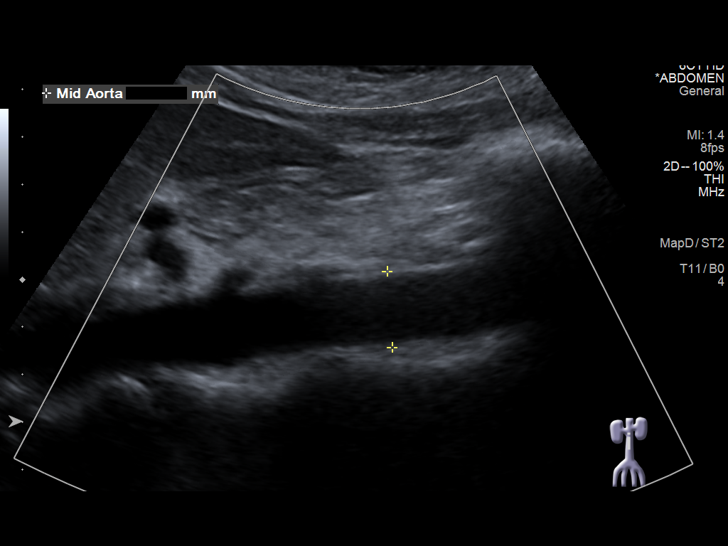

[Series 3: us abdomen complete · 0.11mm/px · 1 of 5 slices shown (2 of 2)]
[im 1/5]
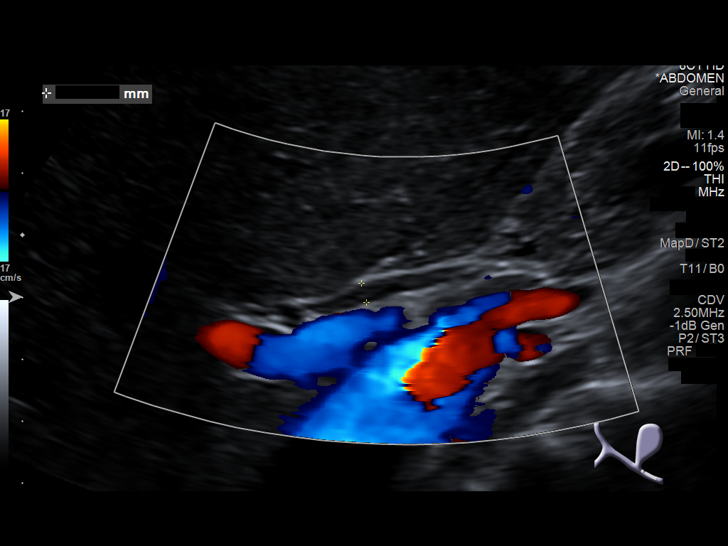

[14 of 25 positions shown; findings below may reference images not displayed]

FINDINGS: Gallbladder: No gallstones or wall thickening visualized. No
sonographic Murphy sign noted by sonographer.

Common bile duct: Diameter: 4 mm

Liver: No focal lesion identified. Within normal limits in
parenchymal echogenicity. Portal vein is patent on color Doppler
imaging with normal direction of blood flow towards the liver.

IVC: No abnormality visualized.

Pancreas: Visualized portion unremarkable.

Spleen: Size and appearance within normal limits.

Right Kidney: Length: 12.8 cm. Echogenicity within normal limits. No
mass or hydronephrosis visualized.

Left Kidney: Length: 11.6 cm. Echogenicity within normal limits. No
mass or hydronephrosis visualized.

Abdominal aorta: No aneurysm visualized.

Other findings: None.
IMPRESSION: Unremarkable abdominal ultrasound.

## 2021-12-11 ENCOUNTER — Ambulatory Visit (INDEPENDENT_AMBULATORY_CARE_PROVIDER_SITE_OTHER): Payer: BC Managed Care – PPO | Admitting: Family Medicine

## 2021-12-11 ENCOUNTER — Encounter: Payer: Self-pay | Admitting: Family Medicine

## 2021-12-11 VITALS — BP 126/68 | HR 57 | Ht 72.0 in | Wt 190.0 lb

## 2021-12-11 DIAGNOSIS — Z0000 Encounter for general adult medical examination without abnormal findings: Secondary | ICD-10-CM | POA: Insufficient documentation

## 2021-12-11 DIAGNOSIS — I10 Essential (primary) hypertension: Secondary | ICD-10-CM

## 2021-12-11 MED ORDER — LISINOPRIL 20 MG PO TABS: 20.0000 mg | ORAL_TABLET | Freq: Two times a day (BID) | ORAL | 0 refills | Status: DC

## 2021-12-11 NOTE — Progress Notes (Addendum)
? ?Acute Office Visit ? ?Subjective:  ? ?  ?Patient ID: Timothy Shea, male    DOB: 07/05/1996, 26 y.o.   MRN: [id number] ? ?Chief Complaint  ?Patient presents with  ? Hypertension  ? ? ?HPI ?Patient is in today for high BPs.  He has had similar symptoms in the past but they actually seem to resolve for quite a while until recently they have come back.  He says that some days he does not notice it at all and other days he will wake up until it will have a little discomfort in his chest.  We will get more easily winded.  And if he tries to exercise or workout on that day he notes his blood pressure and pulse jump up dramatically even with just minimal exercise and effort.  And will stay elevated for even an hour after exercise before it finally comes down.  He says most the time he just tries to push himself through it.  He is pretty athletic and bikes and climbs etc.  So he says it is definitely a notable difference between days where he is feeling this in his ability to exercise and on a regular day.  He says sometimes if he eats or drinks something he will feel a little "better" but not completely resolve his symptoms. ? ?Hypertension-he recently restarted his old lisinopril.  He says that actually seems to be better controlling his blood pressures in the valsartan or the atenolol.  So he has been on that 1 for about 2 weeks now.  He says that it keeps his systolic under [id number]. ? ?Denies any allergy symptoms. ? ?ROS ? ? ?   ?Objective:  ?  ?BP 126/68   Pulse (!) 57   Ht 6' (1.829 m)   Wt 190 lb (86.2 kg)   SpO2 100%   BMI 25.77 kg/m?  ? ? ?Physical Exam ?Constitutional:   ?   Appearance: He is well-developed.  ?HENT:  ?   Head: Normocephalic and atraumatic.  ?   Right Ear: External ear normal.  ?   Left Ear: External ear normal.  ?   Nose: Nose normal.  ?Neck:  ?   Thyroid: No thyromegaly.  ?Cardiovascular:  ?   Rate and Rhythm: Normal rate and regular rhythm.  ?   Heart sounds: Normal heart sounds.   ?Pulmonary:  ?   Effort: Pulmonary effort is normal.  ?   Breath sounds: Normal breath sounds.  ?Abdominal:  ?   General: There is no distension.  ?   Palpations: There is no mass.  ?   Tenderness: There is no abdominal tenderness. There is no guarding or rebound.  ?Musculoskeletal:  ?   Cervical back: Normal range of motion and neck supple.  ?Lymphadenopathy:  ?   Cervical: No cervical adenopathy.  ?Skin: ?   General: Skin is warm and dry.  ?Neurological:  ?   Mental Status: He is alert and oriented to person, place, and time.  ?   Deep Tendon Reflexes: Reflexes are normal and symmetric.  ?Psychiatric:     ?   Behavior: Behavior normal.     ?   Thought Content: Thought content normal.     ?   Judgment: Judgment normal.  ? ? ?No results found for any visits on 12/11/21. ? ? ?   ?Assessment & Plan:  ? ?Problem List Items Addressed This Visit   ? ?  ? Cardiovascular and Mediastinum  ? Essential hypertension -  Primary  ?  Blood pressure here in the office today looks good.  But he still getting some elevated pressures at home so we discussed maybe increasing the lisinopril to 20 mg twice a day.  He notices that sometimes the chest discomfort will be worse towards the end of the day and most wonders if maybe the medication is wearing off and may be that sweats causing him to feel uncomfortable.  I am also still concerned though that he is having discomfort an reduced exercise capacity intermittently.  I would like to do labs to evaluate for possible pheochromocytoma.  We will check a BMP since he is back on an ACE inhibitor.  And if the labs are normal consider referral back to cardiology for further work-up I think he would benefit from further evaluation of his heart while exercising. ? ?  ?  ? Relevant Medications  ? lisinopril (ZESTRIL) 20 MG tablet  ? Other Relevant Orders  ? BASIC METABOLIC PANEL WITH GFR  ? Catecholamines, fractionated, Urine, 24 hour  ? Metanephrines, Urine, 24 hour  ? Catecholamines,  Fractionated, Plasma  ? Metanephrines, plasma  ? ? ?Meds ordered this encounter  ?Medications  ? lisinopril (ZESTRIL) 20 MG tablet  ?  Sig: Take 1 tablet (20 mg total) by mouth in the morning and at bedtime.  ?  Dispense:  180 tablet  ?  Refill:  0  ? ? ?Return in about 2 weeks (around 12/25/2021) for CPE. ? ?Beatrice Lecher, MD ? ? ?

## 2021-12-11 NOTE — Progress Notes (Deleted)
? ?Complete physical exam ? ?Patient: Timothy Shea   DOB: [birth year]   26 y.o. Male  MRN: [id number] ? ?Subjective:  ?  ?No chief complaint on file. ? ? ?[name] is a 26 y.o. male who presents today for a complete physical exam. He reports consuming a {diet types:17450} diet. {types:19826} He generally feels {DESC; WELL/FAIRLY WELL/POORLY:18703}. He reports sleeping {DESC; WELL/FAIRLY WELL/POORLY:18703}. He {does/does not:200015} have additional problems to discuss today.  ? ?Had labs in November 2022 ? ?Most recent fall risk assessment: ? ?  05/16/2021  ?  4:34 PM  ?Fall Risk   ?Falls in the past year? 0  ?Number falls in past yr: 0  ?Injury with Fall? 0  ?Risk for fall due to : No Fall Risks  ?Follow up Falls prevention discussed;Falls evaluation completed  ? ?  ?Most recent depression screenings: ? ?  05/16/2021  ?  4:34 PM 12/08/2019  ?  9:20 AM  ?PHQ 2/9 Scores  ?PHQ - 2 Score 0 0  ? ? ?{VISON DENTAL STD PSA (Optional):27386} ? ?{History (Optional):23778} ? ?Patient Care Team: ?Hali Marry, MD as PCP - General  ? ?Outpatient Medications Prior to Visit  ?Medication Sig  ? atenolol (TENORMIN) 25 MG tablet TAKE 1 TABLET (25 MG TOTAL) BY MOUTH DAILY.  ? ?No facility-administered medications prior to visit.  ? ? ?ROS ? ? ? ? ?   ?Objective:  ? ?  ?There were no vitals taken for this visit. ?{Vitals History (Optional):23777} ? ?Physical Exam ?Constitutional:   ?   Appearance: He is well-developed.  ?HENT:  ?   Head: Normocephalic and atraumatic.  ?   Right Ear: External ear normal.  ?   Left Ear: External ear normal.  ?   Nose: Nose normal.  ?Eyes:  ?   Conjunctiva/sclera: Conjunctivae normal.  ?   Pupils: Pupils are equal, round, and reactive to light.  ?Neck:  ?   Thyroid: No thyromegaly.  ?Cardiovascular:  ?   Rate and Rhythm: Normal rate and regular rhythm.  ?   Heart sounds: Normal heart sounds.  ?Pulmonary:  ?   Effort: Pulmonary effort is normal.  ?   Breath sounds: Normal breath sounds.   ?Abdominal:  ?   General: Bowel sounds are normal. There is no distension.  ?   Palpations: Abdomen is soft. There is no mass.  ?   Tenderness: There is no abdominal tenderness. There is no guarding or rebound.  ?Musculoskeletal:     ?   General: Normal range of motion.  ?   Cervical back: Normal range of motion and neck supple.  ?Lymphadenopathy:  ?   Cervical: No cervical adenopathy.  ?Skin: ?   General: Skin is warm and dry.  ?Neurological:  ?   Mental Status: He is alert and oriented to person, place, and time.  ?   Deep Tendon Reflexes: Reflexes are normal and symmetric.  ?Psychiatric:     ?   Behavior: Behavior normal.     ?   Thought Content: Thought content normal.     ?   Judgment: Judgment normal.  ?  ? ?No results found for any visits on 12/11/21. ?{Show previous labs (optional):23779} ?   ?Assessment & Plan:  ?  ?Routine Health Maintenance and Physical Exam ? ?Immunization History  ?Administered Date(s) Administered  ? DTP 12/17/1994, 02/16/1995, 04/27/1995, 01/13/1996, 10/15/1998  ? HPV Quadrivalent 02/03/2013  ? Hepatitis A 02/03/2013  ? Hepatitis B 04/12/1995, 12/17/1994, 04/27/1995  ?  Influenza,inj,Quad PF,6+ Mos 07/06/2018, 06/28/2020, 05/16/2021  ? Influenza-Unspecified 07/01/2017  ? Janssen (J&J) SARS-COV-2 Vaccination 11/09/2019  ? MMR 01/13/1996, 10/15/1998  ? Meningococcal Conjugate 02/03/2013  ? Meningococcal Polysaccharide 02/25/2007  ? OPV 12/17/1994, 01/13/1996, 02/16/1996, 10/15/1998  ? Tdap 03/20/2009, 01/18/2016  ? Varicella 02/03/2013  ? ? ?Health Maintenance  ?Topic Date Due  ? Hepatitis C Screening  Never done  ? COVID-19 Vaccine (2 - Booster for Janssen series) 01/04/2020  ? INFLUENZA VACCINE  03/11/2022  ? TETANUS/TDAP  01/17/2026  ? Pneumococcal Vaccine 16-80 Years old  Aged Out  ? HPV VACCINES  Aged Out  ? HIV Screening  Discontinued  ? ? ?Discussed health benefits of physical activity, and encouraged him to engage in regular exercise appropriate for his age and  condition. ? ?Problem List Items Addressed This Visit   ?None ?Visit Diagnoses   ? ? Wellness examination    -  Primary  ? ?  ? ?No follow-ups on file. ? ?  ? ?Beatrice Lecher, MD ? ? ?

## 2021-12-11 NOTE — Assessment & Plan Note (Signed)
Blood pressure here in the office today looks good.  But he still getting some elevated pressures at home so we discussed maybe increasing the lisinopril to 20 mg twice a day.  He notices that sometimes the chest discomfort will be worse towards the end of the day and most wonders if maybe the medication is wearing off and may be that sweats causing him to feel uncomfortable.  I am also still concerned though that he is having discomfort an reduced exercise capacity intermittently.  I would like to do labs to evaluate for possible pheochromocytoma.  We will check a BMP since he is back on an ACE inhibitor.  And if the labs are normal consider referral back to cardiology for further work-up I think he would benefit from further evaluation of his heart while exercising. ?

## 2021-12-23 DIAGNOSIS — I10 Essential (primary) hypertension: Secondary | ICD-10-CM | POA: Diagnosis not present

## 2021-12-30 LAB — CATECHOLAMINES, FRACTIONATED, URINE, 24 HOUR
Calc Total (E+NE): 90 mcg/24 h (ref 26–121)
Creatinine, Urine mg/day-CATEUR: 1.6 g/(24.h) (ref 0.50–2.15)
Dopamine 24 Hr Urine: 253 mcg/24 h (ref 52–480)
Norepinephrine, 24H, Ur: 90 mcg/24 h (ref 15–100)
Total Volume: 1000 mL

## 2021-12-30 LAB — METANEPHRINES, URINE, 24 HOUR
METANEPHRINE: 92 mcg/24 h (ref 25–222)
METANEPHRINES, TOTAL: 422 mcg/24 h (ref 94–604)
NORMETANEPHRINE: 330 mcg/24 h (ref 40–412)
Total Volume: 1000 mL

## 2021-12-30 NOTE — Progress Notes (Signed)
Call patient: Negative for the condition called pheochromocytoma.  Lab work looks reassuring.  If you are still having symptoms we could always consider referral to cardiology just to get their opinion to see if they would recommend any additional work-up.

## 2022-01-02 LAB — CATECHOLAMINES, FRACTIONATED, PLASMA
Dopamine: 22 pg/mL — ABNORMAL HIGH
Epinephrine: 23 pg/mL
Norepinephrine: 513 pg/mL
Total Catecholamines: 558 pg/mL

## 2022-01-02 LAB — BASIC METABOLIC PANEL WITH GFR
BUN: 16 mg/dL (ref 7–25)
CO2: 28 mmol/L (ref 20–32)
Calcium: 9.5 mg/dL (ref 8.6–10.3)
Chloride: 103 mmol/L (ref 98–110)
Creat: 0.92 mg/dL (ref 0.60–1.24)
Glucose, Bld: 85 mg/dL (ref 65–99)
Potassium: 4.7 mmol/L (ref 3.5–5.3)
Sodium: 138 mmol/L (ref 135–146)
eGFR: 117 mL/min/{1.73_m2} (ref >=60)

## 2022-01-02 LAB — METANEPHRINES, PLASMA
Metanephrine, Free: 32 pg/mL (ref <=57)
Normetanephrine, Free: 76 pg/mL (ref <=148)
Total Metanephrines-Plasma: 108 pg/mL (ref <=205)

## 2022-01-14 ENCOUNTER — Encounter: Payer: Self-pay | Admitting: Family Medicine

## 2022-01-14 ENCOUNTER — Ambulatory Visit (INDEPENDENT_AMBULATORY_CARE_PROVIDER_SITE_OTHER): Payer: BC Managed Care – PPO | Admitting: Family Medicine

## 2022-01-14 VITALS — BP 118/62 | HR 60 | Resp 18 | Ht 72.0 in | Wt 187.0 lb

## 2022-01-14 DIAGNOSIS — Z0000 Encounter for general adult medical examination without abnormal findings: Secondary | ICD-10-CM

## 2022-01-14 DIAGNOSIS — I10 Essential (primary) hypertension: Secondary | ICD-10-CM | POA: Diagnosis not present

## 2022-01-14 MED ORDER — LISINOPRIL 20 MG PO TABS: 20.0000 mg | ORAL_TABLET | Freq: Every day | ORAL | 0 refills | Status: DC

## 2022-01-14 NOTE — Progress Notes (Signed)
Complete physical exam  Patient: Timothy Shea   DOB: 10/10/1995   26 y.o. Male  MRN: [id number]  Subjective:    Chief Complaint  Patient presents with   Annual Exam    Not fasting     Timothy Shea is a 26 y.o. male who presents today for a complete physical exam. He reports consuming a general diet.  Mountain biking  He generally feels well. He reports sleeping poorly. He does have additional problems to discuss today.    Most recent fall risk assessment:    01/14/2022    8:54 AM  Fall Risk   Falls in the past year? 0  Number falls in past yr: 0  Injury with Fall? 0  Risk for fall due to : No Fall Risks  Follow up Falls prevention discussed;Falls evaluation completed     Most recent depression screenings:    01/14/2022    8:54 AM 05/16/2021    4:34 PM  PHQ 2/9 Scores  PHQ - 2 Score 0 0        Patient Care Team: Hali Marry, MD as PCP - General   Outpatient Medications Prior to Visit  Medication Sig   cyclobenzaprine (FLEXERIL) 10 MG tablet Take 10 mg by mouth as needed for muscle spasms.   lisinopril (ZESTRIL) 20 MG tablet Take 1 tablet (20 mg total) by mouth in the morning and at bedtime.   No facility-administered medications prior to visit.    ROS        Objective:     BP 118/62   Pulse 60   Resp 18   Ht 6' (1.829 m)   Wt 187 lb (84.8 kg)   SpO2 100%   BMI 25.36 kg/m    Physical Exam Constitutional:      Appearance: He is well-developed.  HENT:     Head: Normocephalic and atraumatic.     Right Ear: External ear normal.     Left Ear: External ear normal.     Nose: Nose normal.  Eyes:     Conjunctiva/sclera: Conjunctivae normal.     Pupils: Pupils are equal, round, and reactive to light.  Neck:     Thyroid: No thyromegaly.  Cardiovascular:     Rate and Rhythm: Normal rate and regular rhythm.     Heart sounds: Normal heart sounds.  Pulmonary:     Effort: Pulmonary effort is normal.     Breath sounds: Normal breath  sounds.  Abdominal:     General: Bowel sounds are normal. There is no distension.     Palpations: Abdomen is soft. There is no mass.     Tenderness: There is no abdominal tenderness. There is no guarding or rebound.  Musculoskeletal:        General: Normal range of motion.     Cervical back: Normal range of motion and neck supple.  Lymphadenopathy:     Cervical: No cervical adenopathy.  Skin:    General: Skin is warm and dry.  Neurological:     Mental Status: He is alert and oriented to person, place, and time.     Deep Tendon Reflexes: Reflexes are normal and symmetric.  Psychiatric:        Behavior: Behavior normal.        Thought Content: Thought content normal.        Judgment: Judgment normal.     No results found for any visits on 01/14/22.     Assessment & Plan:  Routine Health Maintenance and Physical Exam  Immunization History  Administered Date(s) Administered   DTP 12/17/1994, 02/16/1995, 04/27/1995, 01/13/1996, 10/15/1998   HPV Quadrivalent 02/03/2013   Hepatitis A 02/03/2013   Hepatitis B 07-15-95, 12/17/1994, 04/27/1995   Influenza,inj,Quad PF,6+ Mos 07/06/2018, 06/28/2020, 05/16/2021   Influenza-Unspecified 07/01/2017   Janssen (J&J) SARS-COV-2 Vaccination 11/09/2019   MMR 01/13/1996, 10/15/1998   Meningococcal Conjugate 02/03/2013   Meningococcal Polysaccharide 02/25/2007   OPV 12/17/1994, 01/13/1996, 02/16/1996, 10/15/1998   Tdap 03/20/2009, 01/18/2016   Varicella 02/03/2013    Health Maintenance  Topic Date Due   Hepatitis C Screening  Never done   COVID-19 Vaccine (2 - Booster for Janssen series) 01/30/2022 (Originally 01/04/2020)   INFLUENZA VACCINE  03/11/2022   TETANUS/TDAP  01/17/2026   Pneumococcal Vaccine 18-63 Years old  Aged Out   HPV VACCINES  Aged Out   HIV Screening  Discontinued    Discussed health benefits of physical activity, and encouraged him to engage in regular exercise appropriate for his age and condition.  Problem  List Items Addressed This Visit       Other   Wellness examination - Primary   Keep up a regular exercise program and make sure you are eating a healthy diet Try to eat 4 servings of dairy a day, or if you are lactose intolerant take a calcium with vitamin D daily.  Your vaccines are up to date.    Return in about 1 year (around 01/15/2023) for Wellness Exam.     Beatrice Lecher, MD

## 2022-03-10 ENCOUNTER — Other Ambulatory Visit: Payer: Self-pay | Admitting: Family Medicine

## 2022-03-10 DIAGNOSIS — I10 Essential (primary) hypertension: Secondary | ICD-10-CM

## 2022-04-21 ENCOUNTER — Ambulatory Visit (INDEPENDENT_AMBULATORY_CARE_PROVIDER_SITE_OTHER): Payer: BC Managed Care – PPO

## 2022-04-21 ENCOUNTER — Ambulatory Visit
Admission: RE | Admit: 2022-04-21 | Discharge: 2022-04-21 | Disposition: A | Discharge status: Home / Self Care | Payer: BC Managed Care – PPO | Source: Ambulatory Visit | Attending: Family Medicine | Admitting: Family Medicine

## 2022-04-21 VITALS — BP 126/84 | HR 55 | Temp 98.7°F | Resp 18 | Ht 74.0 in | Wt 180.0 lb

## 2022-04-21 DIAGNOSIS — M778 Other enthesopathies, not elsewhere classified: Secondary | ICD-10-CM | POA: Diagnosis not present

## 2022-04-21 DIAGNOSIS — M25531 Pain in right wrist: Secondary | ICD-10-CM | POA: Diagnosis not present

## 2022-04-21 NOTE — ED Provider Notes (Signed)
[facility]    CSN: [id number] Arrival date & time: 04/21/22  1640      History   Chief Complaint Chief Complaint  Patient presents with  . Wrist Pain    injured my right wrist about 2 weeks ago and it has not improved. it hurts when lifting small weight or when bending it fully up or down - Entered by patient  . Appointment    HPI Timothy Shea is a 26 y.o. male.    Wrist Pain  Past Medical History:  Diagnosis Date  . Essential hypertension 08/31/2012    Patient Active Problem List   Diagnosis Date Noted  . Wellness examination 12/11/2021  . Increased thirst 05/16/2021  . Polyuria 05/16/2021  . Essential hypertension 08/31/2012  . Hyperlipidemia 08/06/2010  . DYSHIDROTIC ECZEMA, HANDS 04/27/2007    History reviewed. No pertinent surgical history.     Home Medications    Prior to Admission medications   Medication Sig Start Date End Date Taking? Authorizing Provider  cyclobenzaprine (FLEXERIL) 10 MG tablet Take 10 mg by mouth as needed for muscle spasms.   Yes [provider]  lisinopril (ZESTRIL) 20 MG tablet TAKE 1 TABLET (20 MG TOTAL) BY MOUTH IN THE MORNING AND AT BEDTIME 03/10/22  Yes Agapito Games, MD    Family History Family History  Problem Relation Age of Onset  . Depression Mother   . Hypothyroidism Mother   . Hypertension Father   . Healthy Sister   . Healthy Brother     Social History Social History   Tobacco Use  . Smoking status: Never  . Smokeless tobacco: Never  Vaping Use  . Vaping Use: Never used  Substance Use Topics  . Alcohol use: Yes    Alcohol/week: 1.0 standard drink of alcohol    Types: 1 Cans of beer per week    Comment: 1 drink per month  . Drug use: No     Allergies   Patient has no known allergies.   Review of Systems Review of Systems   Physical Exam Triage Vital Signs ED Triage Vitals  Enc Vitals Group     BP 04/21/22 1715 126/84     Pulse Rate 04/21/22 1715 (!) 55      Resp 04/21/22 1715 18     Temp 04/21/22 1715 98.7 F (37.1 C)     Temp Source 04/21/22 1715 Oral     SpO2 04/21/22 1715 99 %     Weight 04/21/22 1716 180 lb (81.6 kg)     Height 04/21/22 1716 6\' 2"  (1.88 m)     Head Circumference --      Peak Flow --      Pain Score 04/21/22 1716 3     Pain Loc --      Pain Edu? --      Excl. in GC? --    No data found.  Updated Vital Signs BP 126/84 (BP Location: Right Arm)   Pulse (!) 55   Temp 98.7 F (37.1 C) (Oral)   Resp 18   Ht 6\' 2"  (1.88 m)   Wt 81.6 kg   SpO2 99%   BMI 23.11 kg/m   Visual Acuity Right Eye Distance:   Left Eye Distance:   Bilateral Distance:    Right Eye Near:   Left Eye Near:    Bilateral Near:     Physical Exam Musculoskeletal:       Hands:    UC Treatments / Results  Labs (all labs ordered are listed, but only abnormal results are displayed) Labs Reviewed - No data to display  EKG   Radiology DG Wrist Complete Right  Result Date: 04/21/2022 CLINICAL DATA:  Wrist pain. EXAM: RIGHT WRIST - COMPLETE 3+ VIEW COMPARISON:  None Available. FINDINGS: There is no evidence of fracture or dislocation. There is no evidence of arthropathy or other focal bone abnormality. Soft tissues are unremarkable. IMPRESSION: Negative. Electronically Signed   By: Darliss Cheney M.D.   On: 04/21/2022 17:28    Procedures Procedures (including critical care time)  Medications Ordered in UC Medications - No data to display  Initial Impression / Assessment and Plan / UC Course  I have reviewed the triage vital signs and the nursing notes.  Pertinent labs & imaging results that were available during my care of the patient were reviewed by me and considered in my medical decision making (see chart for details).    Splint applied. Followup with [facility] if not improving about two weeks.   Final Clinical Impressions(s) / UC Diagnoses   Final diagnoses:  Tendonitis of right hand     Discharge  Instructions      Apply diclofenac gel to top of hand three times daily.  Continue splint.  Avoid offending activities.    ED Prescriptions   None    PDMP not reviewed this encounter.

## 2022-04-21 NOTE — Discharge Instructions (Signed)
Apply diclofenac gel to top of hand three times daily.  Continue splint.  Avoid offending activities.

## 2022-04-21 NOTE — ED Notes (Signed)
Patient decided that he did want the right wrist brace.  He felt is was to interfere with his day to day activities.

## 2022-04-21 NOTE — ED Triage Notes (Signed)
Patient states that he injured his wrist x 2 weeks ago.  Doesn't know the specific injury but he does a lot mt biking and exercising.  Patient has taken Ibuprofen prn.

## 2022-09-24 ENCOUNTER — Other Ambulatory Visit: Payer: Self-pay | Admitting: Family Medicine

## 2022-09-24 DIAGNOSIS — I10 Essential (primary) hypertension: Secondary | ICD-10-CM

## 2022-11-11 ENCOUNTER — Encounter: Payer: Self-pay | Admitting: Family Medicine

## 2022-11-11 ENCOUNTER — Ambulatory Visit (INDEPENDENT_AMBULATORY_CARE_PROVIDER_SITE_OTHER): Payer: BC Managed Care – PPO | Admitting: Family Medicine

## 2022-11-11 VITALS — BP 119/60 | HR 61 | Temp 98.4°F | Resp 18 | Ht 74.0 in | Wt 193.6 lb

## 2022-11-11 DIAGNOSIS — G4700 Insomnia, unspecified: Secondary | ICD-10-CM | POA: Diagnosis not present

## 2022-11-11 DIAGNOSIS — H9192 Unspecified hearing loss, left ear: Secondary | ICD-10-CM | POA: Diagnosis not present

## 2022-11-11 DIAGNOSIS — H6992 Unspecified Eustachian tube disorder, left ear: Secondary | ICD-10-CM | POA: Diagnosis not present

## 2022-11-11 MED ORDER — FLUTICASONE PROPIONATE 50 MCG/ACT NA SUSP: 2.0000 | Freq: Every day | NASAL | 1 refills | Status: DC

## 2022-11-11 NOTE — Patient Instructions (Addendum)
Recommended trial of over-the-counter Benadryl.  It usually comes in 25 mg.  Start with 1 for couple of nights and then okay to increase to 2 if needed.  If you do not feel like it is helpful or you do not feel good on the medication please let me know and we can always try something prescription.  Continue to work on good sleep hygiene.  Recommend 2 sprays in each nostril with a nasal steroid spray until you see the ENT.  Will be helpful to let them know if it is improved or not improved with the medication.

## 2022-11-11 NOTE — Progress Notes (Signed)
Acute Office Visit  Subjective:     Patient ID: Timothy Shea, male    DOB: 1995/09/08, 27 y.o.   MRN: [id number]  Chief Complaint  Patient presents with   Hearing Problem    Patient states for the past 2 months he has been having hearing issues in his left ear. He states that he has some mild pressure, Patient also has complaints of sleeping issues he states that for the past 6 months he is finding it harder to fall asleep and stay asleep he states he barely gets 3-4 hours.     HPI Patient is in today for decreased hearing out of the left ear x 2 months. Feels some pressure and fullness.  He denies any recent upper respiratory infections or colds.  No prior history of ear surgery.  No recent trauma.  Not sleeping well for almost 6 months. Hard time falling asleep. ONly getting 3-4 hours.  He denies any caffeine intake at all.  He does try to go to bed around the same time and wake up around the same time.  He says his thoughts are just random it is not necessarily that he is worrying.  He does use white noise to help him sleep.  He did try over-the-counter melatonin but said it really did not work.  Occasionally uses ibuprofen but says he has to take a large amount to get an effect.  He denies snoring.  ROS      Objective:    BP 119/60   Pulse 61   Temp 98.4 F (36.9 C) (Oral)   Resp 18   Ht 6\' 2"  (1.88 m)   Wt 193 lb 9.6 oz (87.8 kg)   SpO2 100%   BMI 24.86 kg/m    Physical Exam Constitutional:      Appearance: He is well-developed.  HENT:     Head: Normocephalic and atraumatic.     Right Ear: Tympanic membrane, ear canal and external ear normal.     Left Ear: Tympanic membrane, ear canal and external ear normal.     Ears:     Comments: Light reflex on the right TM is stretched out.  But the light reflex on the left TM is also at the 6 o'clock position which is shifted out of the normal range.  But otherwise no erythema or thickening of the TM.  No effusion  present.    Nose: Nose normal.     Mouth/Throat:     Pharynx: Oropharynx is clear.  Eyes:     Conjunctiva/sclera: Conjunctivae normal.     Pupils: Pupils are equal, round, and reactive to light.  Neck:     Thyroid: No thyromegaly.  Cardiovascular:     Rate and Rhythm: Normal rate.     Heart sounds: Normal heart sounds.  Pulmonary:     Effort: Pulmonary effort is normal.     Breath sounds: Normal breath sounds.  Musculoskeletal:     Cervical back: Neck supple.  Lymphadenopathy:     Cervical: No cervical adenopathy.  Skin:    General: Skin is warm and dry.  Neurological:     Mental Status: He is alert and oriented to person, place, and time.     No results found for any visits on 11/11/22.      Assessment & Plan:   Problem List Items Addressed This Visit   None Visit Diagnoses     Hearing loss of left ear, unspecified hearing loss type    -  Primary   Relevant Orders   Ambulatory referral to ENT   Insomnia, unspecified type       Dysfunction of left eustachian tube       Relevant Orders   Ambulatory referral to ENT      Hearing loss in the left ear-tympanometry was essentially normal on that side today.  We recommended a trial of fluticasone for possible eustachian tube dysfunction and referral to ENT for further workup especially since it has been going on for 2 months at this point and he also feels like there is some hearing loss.  Insomnia-already tried melatonin and it was not helpful he actually has pretty good sleep hygiene routine.  Though I did provide some additional information in that regard.  Recommended trial of oral Benadryl 25 to 50 mg.  If not helpful then consider trial of trazodone he will send me a MyChart in a couple weeks and let me know how he is doing.    Meds ordered this encounter  Medications   fluticasone (FLONASE) 50 MCG/ACT nasal spray    Sig: Place 2 sprays into both nostrils daily.    Dispense:  16 g    Refill:  1    No  follow-ups on file.  Beatrice Lecher, MD

## 2022-12-03 ENCOUNTER — Other Ambulatory Visit: Payer: Self-pay | Admitting: Family Medicine

## 2022-12-16 ENCOUNTER — Ambulatory Visit
Admission: RE | Admit: 2022-12-16 | Discharge: 2022-12-16 | Disposition: A | Discharge status: Home / Self Care | Payer: BC Managed Care – PPO | Source: Ambulatory Visit | Attending: Physician Assistant | Admitting: Physician Assistant

## 2022-12-16 VITALS — BP 145/80 | HR 68 | Temp 97.8°F | Resp 17

## 2022-12-16 DIAGNOSIS — M778 Other enthesopathies, not elsewhere classified: Secondary | ICD-10-CM

## 2022-12-16 MED ORDER — NAPROXEN 375 MG PO TABS: 375.0000 mg | ORAL_TABLET | Freq: Two times a day (BID) | ORAL | 0 refills | Status: DC

## 2022-12-16 NOTE — Discharge Instructions (Signed)
Take Naprosyn twice daily for up to 10 days.  Do not take additional NSAIDs with this medication including aspirin, ibuprofen/Advil, naproxen/Aleve.  You can use acetaminophen/Tylenol for breakthrough pain.  Use the brace as we discussed.  Avoid strenuous activity including repetitive motions.  If your symptoms or not improving quickly please follow-up with sports medicine; call to schedule an appointment.  If you have any worsening symptoms including increasing pain, numbness, tingling sensation, decreased range of motion you need to be seen immediately.

## 2022-12-16 NOTE — ED Provider Notes (Signed)
[facility]    CSN: [id number] Arrival date & time: 12/16/22  1512      History   Chief Complaint Chief Complaint  Patient presents with   Wrist Pain    RT, APPT 315PM    HPI Timothy Shea is a 27 y.o. male.   Patient presents today with a several week history of recurrent right hand and wrist pain.  He denies any known injury prior to symptom onset but does report that he has a physically demanding job requiring him to perform repetitive movements with his right hand/wrist and wonders if this could have contributed to symptoms.  He was seen for similar symptoms 04/30/2022 at which point they resolved with conservative treatment measures.  He has not seen orthopedic provider in the past.  He is right-handed.  Denies any numbness or paresthesias in the right hand.  He reports that pain is minimal at rest but will increase with certain movements along the dorsal portion of his right hand into his wrist and forearm.  He will also occasionally have a sharp pain along his ulnar wrist that lasts for a few seconds and then resolves without intervention.  He has tried ibuprofen with temporary relief of symptoms.  Denies any previous injury or surgery involving his hand.    Past Medical History:  Diagnosis Date   Essential hypertension 08/31/2012    Patient Active Problem List   Diagnosis Date Noted   Wellness examination 12/11/2021   Increased thirst 05/16/2021   Polyuria 05/16/2021   Essential hypertension 08/31/2012   Hyperlipidemia 08/06/2010   DYSHIDROTIC ECZEMA, HANDS 04/27/2007    History reviewed. No pertinent surgical history.     Home Medications    Prior to Admission medications   Medication Sig Start Date End Date Taking? Authorizing Provider  naproxen (NAPROSYN) 375 MG tablet Take 1 tablet (375 mg total) by mouth 2 (two) times daily. 12/16/22  Yes Flossie Wexler K, PA-C  cyclobenzaprine (FLEXERIL) 10 MG tablet Take 10 mg by mouth as needed for muscle  spasms.    [provider]  fluticasone (FLONASE) 50 MCG/ACT nasal spray SPRAY 2 SPRAYS INTO EACH NOSTRIL EVERY DAY 12/03/22   Agapito Games, MD  lisinopril (ZESTRIL) 20 MG tablet TAKE 1 TABLET (20 MG TOTAL) BY MOUTH IN THE MORNING AND AT BEDTIME 09/24/22   Agapito Games, MD    Family History Family History  Problem Relation Age of Onset   Depression Mother    Hypothyroidism Mother    Hypertension Father    Healthy Sister    Healthy Brother     Social History Social History   Tobacco Use   Smoking status: Never   Smokeless tobacco: Never  Vaping Use   Vaping Use: Never used  Substance Use Topics   Alcohol use: Yes    Alcohol/week: 1.0 standard drink of alcohol    Types: 1 Cans of beer per week    Comment: 1 drink per month   Drug use: No     Allergies   Patient has no known allergies.   Review of Systems Review of Systems  Constitutional:  Positive for activity change. Negative for appetite change, fatigue and fever.  Musculoskeletal:  Positive for arthralgias. Negative for gait problem, joint swelling and myalgias.  Skin:  Negative for color change and wound.  Neurological:  Negative for weakness and numbness.     Physical Exam Triage Vital Signs ED Triage Vitals  Enc Vitals Group  BP 12/16/22 1520 (!) 145/80     Pulse Rate 12/16/22 1520 68     Resp 12/16/22 1520 17     Temp 12/16/22 1520 97.8 F (36.6 C)     Temp Source 12/16/22 1520 Oral     SpO2 12/16/22 1520 100 %     Weight --      Height --      Head Circumference --      Peak Flow --      Pain Score 12/16/22 1522 1     Pain Loc --      Pain Edu? --      Excl. in GC? --    No data found.  Updated Vital Signs BP (!) 145/80 (BP Location: Left Arm)   Pulse 68   Temp 97.8 F (36.6 C) (Oral)   Resp 17   SpO2 100%   Visual Acuity Right Eye Distance:   Left Eye Distance:   Bilateral Distance:    Right Eye Near:   Left Eye Near:    Bilateral Near:      Physical Exam Vitals reviewed.  Constitutional:      General: He is awake.     Appearance: Normal appearance. He is well-developed. He is not ill-appearing.     Comments: Very pleasant male appears stated age in no acute distress sitting comfortably in exam room  HENT:     Head: Normocephalic and atraumatic.  Cardiovascular:     Rate and Rhythm: Normal rate and regular rhythm.     Heart sounds: Normal heart sounds, S1 normal and S2 normal. No murmur heard.    Comments: Capillary refill within 2 seconds right hands Pulmonary:     Effort: Pulmonary effort is normal.     Breath sounds: Normal breath sounds. No stridor. No wheezing, rhonchi or rales.     Comments: Clear to auscultation bilaterally Musculoskeletal:     Right wrist: No swelling, tenderness, bony tenderness or snuff box tenderness. Normal range of motion.     Right hand: No swelling, tenderness or bony tenderness. Normal range of motion. There is no disruption of two-point discrimination. Normal capillary refill.     Comments: Right hand/wrist: Hand is neurovascularly intact.  Normal active range of motion of phalanges and wrist.  No snuffbox tenderness.  No point bony tenderness.  Normal pincer and grip strength.  Negative Finkelstein.  Neurological:     Mental Status: He is alert.  Psychiatric:        Behavior: Behavior is cooperative.      UC Treatments / Results  Labs (all labs ordered are listed, but only abnormal results are displayed) Labs Reviewed - No data to display  EKG   Radiology No results found.  Procedures Procedures (including critical care time)  Medications Ordered in UC Medications - No data to display  Initial Impression / Assessment and Plan / UC Course  I have reviewed the triage vital signs and the nursing notes.  Pertinent labs & imaging results that were available during my care of the patient were reviewed by me and considered in my medical decision making (see chart for  details).     Patient is well-appearing, afebrile, nontoxic, nontachycardic.  Plain films were deferred as he denies any recent trauma and has no focal bony tenderness.  Suspect tendinitis as etiology of symptoms.  He was started on Naprosyn for pain relief.  Discussed that he is not to take NSAIDs with this medication due to risk of  GI bleeding but can use acetaminophen/Tylenol for breakthrough pain.  Recommend he avoid strenuous activities including repetitive motion and use of brace for comfort and support.  If his symptoms not improving quickly he is to follow-up with sports medicine and was given contact information for local provider with instruction to call to schedule an appointment.  If he has any worsening symptoms including increasing pain, erythema/swelling, numbness or paresthesias, decreased range of motion he should be seen immediately.  Strict return precautions given.  Work excuse note provided.  Final Clinical Impressions(s) / UC Diagnoses   Final diagnoses:  Right wrist tendonitis     Discharge Instructions      Take Naprosyn twice daily for up to 10 days.  Do not take additional NSAIDs with this medication including aspirin, ibuprofen/Advil, naproxen/Aleve.  You can use acetaminophen/Tylenol for breakthrough pain.  Use the brace as we discussed.  Avoid strenuous activity including repetitive motions.  If your symptoms or not improving quickly please follow-up with sports medicine; call to schedule an appointment.  If you have any worsening symptoms including increasing pain, numbness, tingling sensation, decreased range of motion you need to be seen immediately.     ED Prescriptions     Medication Sig Dispense Auth. Provider   naproxen (NAPROSYN) 375 MG tablet Take 1 tablet (375 mg total) by mouth 2 (two) times daily. 20 tablet April Colter, Noberto Retort, PA-C      PDMP not reviewed this encounter.   Jeani Hawking, PA-C 12/16/22 1539

## 2022-12-16 NOTE — ED Triage Notes (Addendum)
Pt c/o RT wrist pain x 3 weeks. Worsening in last week. No ltd range of motion in wrist, but hurts when he tries to grip anything. Using heat and tylenol prn.. Seen in 9/23 for similar sxs, dx with tendonitis. but says this feels a little different.

## 2022-12-24 DIAGNOSIS — H903 Sensorineural hearing loss, bilateral: Secondary | ICD-10-CM | POA: Diagnosis not present

## 2022-12-30 ENCOUNTER — Other Ambulatory Visit: Payer: Self-pay

## 2022-12-30 ENCOUNTER — Ambulatory Visit (INDEPENDENT_AMBULATORY_CARE_PROVIDER_SITE_OTHER): Payer: BC Managed Care – PPO | Admitting: Sports Medicine

## 2022-12-30 VITALS — BP 120/70 | Ht 74.0 in | Wt 190.0 lb

## 2022-12-30 DIAGNOSIS — M25531 Pain in right wrist: Secondary | ICD-10-CM

## 2022-12-30 DIAGNOSIS — M778 Other enthesopathies, not elsewhere classified: Secondary | ICD-10-CM | POA: Diagnosis not present

## 2022-12-30 NOTE — Patient Instructions (Signed)
Stage 1: Decrease Inflammation  1) Rub in topical Voltaren Gel over area of pain/inflammation 4x/day for 2-3 weeks 2) Wear gentle compression sleeve over wrist to help keep out additional swelling 3) At end of day, complete ice massage over wrist by freezing a Dixie cup and tear of paper until ice is exposed. Massage with ice for 10-20 minutes with taking short breaks as needed 4) After that, can complete gentle range of motion and stretching exercises in warm water   After Stage 1 (first 2-3 weeks) can progress to gentle strengthening exercising.   Return to clinic in 1 month if symptoms not adequately improving.

## 2022-12-30 NOTE — Progress Notes (Signed)
Subjective:    Patient ID: Timothy Shea, male    DOB: Mar 18, 1996, 27 y.o.   MRN: [id number]  HPI  Patient is a 27 year old male with past history of generalized wrist tendinitis in November 2023 who presents to clinic today with worsening dorsal wrist pain over the past 4 weeks.  Patient's hobbies include climbing/biking and is overall fairly active.  Patient also works as a Engineer, manufacturing lots of intrinsic wrist movements where he has to frequently pick up 3 to 5 pound weights and perform extension, supination, pronation activities with them throughout the day.  Patient states that beginning 4 weeks ago he began having pain over his wrist extensor tendons.  Initially it was primarily his extensor carpi radialis brevis/longus but has now progressed to include extensor digitorum.  Roughly 2 weeks ago he went and saw urgent care who gave him a prescription for naproxen twice daily for 10 days and he also began wearing a wrist splint he bought off [employer].  He has had to withhold from his normal work activities as repetitive wrist activities causes flareups with throbbing pain.  Feels like the braces helped somewhat but if he wears it too long it will cause additional pain from areas of contact/abrasion with skin.  Today's pain is primary located over wrist extensor tendons as they cross over his distal radius.  No loss of sensation.  Denies numbness tingling.  Full range of motion.  Maintains good strength.  Minimal tenderness to palpation.  Denies any trauma or recent falls.  Review of Systems  Negative except as noted above    Objective:   Physical Exam Constitutional:      Appearance: Normal appearance. He is normal weight.  Eyes:     Conjunctiva/sclera: Conjunctivae normal.  Musculoskeletal:        General: Tenderness present. No swelling or deformity. Normal range of motion.     Comments: Patient with a right wrist pain where extensor tendons crossover radiocarpal junction.  Also  radiates distally into dorsal aspect of hand.  No erythema, generalized edema, or ecchymoses on inspection.  Generalized tenderness to palpation but no specific acute pain.  No history of trauma and bony landmarks negative for point tenderness.  Range of motion within normal limits.  Does have good strength with resisted wrist extension and flexion.  Neurovascularly intact with normal interossei testing and okay sign.  Does have some reproducible pain with resisted finger extension, specifically with second third and fourth digits.  Finkelstein test negative.  Positive Tinel sign over median nerve but not consistent with remainder of exam.  Normal stability.  No weakness pain elucidated upon TFCC testing.  Primary complaint consistent with wrist extensor tendinitis specifically extensor carpi radialis brevis/longus and extensor digitorum.  Ultrasound examination consistent with findings described above with imaging showing chronic tendinitis with generalized edema over wrist extensors tendons.  Skin:    General: Skin is warm and dry.  Neurological:     General: No focal deficit present.     Mental Status: He is alert. Mental status is at baseline.  Psychiatric:        Mood and Affect: Mood normal.        Behavior: Behavior normal.        Thought Content: Thought content normal.        Judgment: Judgment normal.   Ultrasound of Right wrist  Comp 1 is normal except accessory ABL Comp 2: on long and short axis increased hypoechoic change over ECRL and ECRB.  On lLAX the sheath of the extensor tendons looks thickened Comp 3 normal Comp 4 : increased hypoechoic change noted around extensor tendons Comps. 5 and 6 : normal  Median nerve area is 0.10 cm2  Ultrasound and interpretation by Sibyl Parr. Ardath Lepak, MD           Assessment & Plan:   Right wrist extensor tendinitis:   Examined under Ultrasound. Swelling/inflammation seen over extensor carpi radialis brevis/longus and extensor  digitorum.  Stage 1 of Treatment: Decrease Inflammation 1) Rub in topical Voltaren Gel over area of pain/inflammation 4x/day for 2-3 weeks 2) Wear gentle compression sleeve over wrist to help keep out additional swelling 3) At end of day, complete ice massage over wrist by freezing a Dixie cup and tear of paper until ice is exposed. Massage with ice for 10-20 minutes with taking short breaks as needed 4) After that, can complete gentle range of motion and stretching exercises in warm water   After Stage 1 (first 2-3 weeks) can progress to gentle strengthening exercising.  Demonstrated wrist extension exercises as well as pronation/supination.  Primary placed on eccentric portion of exercise.  Return to clinic in 1 month if symptoms not adequately improving.   I observed and examined the patient with the Dr. Fayrene Fearing, R2 and agree with assessment and plan.  Note reviewed and modified by me.  Sterling Big, MD

## 2022-12-30 NOTE — Assessment & Plan Note (Signed)
Compression sleeve for wrist Top voltaren Ice massage at day end Gradually progress to extensor HEP Reck in 4 to 6 weeks

## 2022-12-31 ENCOUNTER — Ambulatory Visit: Payer: BC Managed Care – PPO | Admitting: Family Medicine

## 2023-01-01 ENCOUNTER — Telehealth: Payer: Self-pay | Admitting: Family Medicine

## 2023-01-01 NOTE — Telephone Encounter (Signed)
He wasn't sure about who he needed to get papers filled and you haven't seen him for this I will let him know that he needs to contact the providers that have seen him for this condition

## 2023-01-01 NOTE — Telephone Encounter (Signed)
What condition is this for? I am not aware of any short term disability

## 2023-01-01 NOTE — Telephone Encounter (Signed)
Patient called to check the status of Short [place]ern Disability form he is not sure who to send it to and get filled out please adise

## 2023-01-16 ENCOUNTER — Encounter: Payer: BC Managed Care – PPO | Admitting: Family Medicine

## 2023-01-29 ENCOUNTER — Ambulatory Visit (INDEPENDENT_AMBULATORY_CARE_PROVIDER_SITE_OTHER): Payer: BC Managed Care – PPO | Admitting: Sports Medicine

## 2023-01-29 VITALS — BP 108/84 | Ht 74.0 in | Wt 190.0 lb

## 2023-01-29 DIAGNOSIS — M778 Other enthesopathies, not elsewhere classified: Secondary | ICD-10-CM

## 2023-01-29 NOTE — Progress Notes (Signed)
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

## 2023-01-29 NOTE — Assessment & Plan Note (Signed)
I suggested he avoid the repetitive motion that he has to do at work until July 8 In the meantime we will progress his treatment to include home exercises using light weight He should do extensor exercises for his wrist Forearm rolls Handgrip Continue with his compression sleeve when active Continue with topical Voltaren  I suggested he can try returning to work on July 8.  If this continues to give him trouble I will be happy to see him in follow-up Return to work form completed

## 2023-02-03 ENCOUNTER — Ambulatory Visit: Payer: BC Managed Care – PPO | Admitting: Sports Medicine

## 2023-03-09 ENCOUNTER — Ambulatory Visit (INDEPENDENT_AMBULATORY_CARE_PROVIDER_SITE_OTHER): Payer: BC Managed Care – PPO | Admitting: Family Medicine

## 2023-03-09 VITALS — BP 131/81 | HR 63 | Wt 191.0 lb

## 2023-03-09 DIAGNOSIS — I10 Essential (primary) hypertension: Secondary | ICD-10-CM

## 2023-03-09 DIAGNOSIS — Z0000 Encounter for general adult medical examination without abnormal findings: Secondary | ICD-10-CM | POA: Diagnosis not present

## 2023-03-09 DIAGNOSIS — Z23 Encounter for immunization: Secondary | ICD-10-CM | POA: Diagnosis not present

## 2023-03-09 MED ORDER — LISINOPRIL 20 MG PO TABS: 20.0000 mg | ORAL_TABLET | Freq: Every day | ORAL | 1 refills | Status: DC

## 2023-03-09 NOTE — Progress Notes (Signed)
Complete physical exam  Patient: Timothy Shea   DOB: 1995/09/04   27 y.o. Male  MRN: [id number]  Subjective:    Chief Complaint  Patient presents with   Annual Exam    Timothy Shea is a 27 y.o. male who presents today for a complete physical exam. He reports consuming a general diet.  2-3 days per week of exercise   He generally feels well. He does not have additional problems to discuss today.    Most recent fall risk assessment:    11/11/2022    3:49 PM  Fall Risk   Number falls in past yr: 0  Injury with Fall? 0  Risk for fall due to : No Fall Risks  Follow up Falls evaluation completed     Most recent depression screenings:    11/11/2022    3:49 PM 01/14/2022    8:54 AM  PHQ 2/9 Scores  PHQ - 2 Score 0 0        Patient Care Team: Agapito Games, MD as PCP - General   Outpatient Medications Prior to Visit  Medication Sig   cyclobenzaprine (FLEXERIL) 10 MG tablet Take 10 mg by mouth as needed for muscle spasms.   fluticasone (FLONASE) 50 MCG/ACT nasal spray SPRAY 2 SPRAYS INTO EACH NOSTRIL EVERY DAY   naproxen (NAPROSYN) 375 MG tablet Take 1 tablet (375 mg total) by mouth 2 (two) times daily.   [DISCONTINUED] lisinopril (ZESTRIL) 20 MG tablet TAKE 1 TABLET (20 MG TOTAL) BY MOUTH IN THE MORNING AND AT BEDTIME   No facility-administered medications prior to visit.    ROS        Objective:     BP 131/81   Pulse 63   Wt 191 lb (86.6 kg)   BMI 24.52 kg/m    Physical Exam Constitutional:      Appearance: He is well-developed.  HENT:     Head: Normocephalic and atraumatic.     Right Ear: Tympanic membrane, ear canal and external ear normal.     Left Ear: Tympanic membrane, ear canal and external ear normal.     Nose: Nose normal.     Mouth/Throat:     Pharynx: Oropharynx is clear.  Eyes:     Conjunctiva/sclera: Conjunctivae normal.     Pupils: Pupils are equal, round, and reactive to light.  Neck:     Thyroid: No thyromegaly.   Cardiovascular:     Rate and Rhythm: Normal rate and regular rhythm.     Heart sounds: Normal heart sounds.  Pulmonary:     Effort: Pulmonary effort is normal.     Breath sounds: Normal breath sounds.  Abdominal:     General: Bowel sounds are normal. There is no distension.     Palpations: Abdomen is soft. There is no mass.     Tenderness: There is no abdominal tenderness. There is no guarding or rebound.  Musculoskeletal:        General: Normal range of motion.     Cervical back: Normal range of motion and neck supple. No tenderness.  Lymphadenopathy:     Cervical: No cervical adenopathy.  Skin:    General: Skin is warm and dry.  Neurological:     Mental Status: He is alert and oriented to person, place, and time.     Deep Tendon Reflexes: Reflexes are normal and symmetric.  Psychiatric:        Behavior: Behavior normal.        Thought Content:  Thought content normal.        Judgment: Judgment normal.      No results found for any visits on 03/09/23.     Assessment & Plan:    Routine Health Maintenance and Physical Exam  Immunization History  Administered Date(s) Administered   DTP 12/17/1994, 02/16/1995, 04/27/1995, 01/13/1996, 10/15/1998   HPV 9-valent 03/09/2023   HPV Quadrivalent 02/03/2013   Hepatitis A 02/03/2013   Hepatitis B 01/31/1995, 12/17/1994, 04/27/1995   Influenza,inj,Quad PF,6+ Mos 07/06/2018, 06/28/2020, 05/16/2021   Influenza-Unspecified 07/01/2017   Janssen (J&J) SARS-COV-2 Vaccination 11/09/2019   MMR 01/13/1996, 10/15/1998   Meningococcal Conjugate 02/03/2013   Meningococcal polysaccharide vaccine (MPSV4) 02/25/2007   OPV 12/17/1994, 01/13/1996, 02/16/1996, 10/15/1998   Tdap 03/20/2009, 01/18/2016   Varicella 02/03/2013    Health Maintenance  Topic Date Due   Hepatitis C Screening  Never done   COVID-19 Vaccine (2 - 2023-24 season) 04/11/2022   HPV VACCINES (3 - Male 3-dose series) 06/01/2023   INFLUENZA VACCINE  03/12/2023    DTaP/Tdap/Td (8 - Td or Tdap) 01/17/2026   Pneumococcal Vaccine 44-25 Years old  Aged Out   HIV Screening  Discontinued    Discussed health benefits of physical activity, and encouraged him to engage in regular exercise appropriate for his age and condition.  Problem List Items Addressed This Visit       Cardiovascular and Mediastinum   Essential hypertension   Relevant Medications   lisinopril (ZESTRIL) 20 MG tablet     Other   Wellness examination - Primary   Relevant Orders   Lipid Panel With LDL/HDL Ratio   CMP14+EGFR   VITAMIN D 25 Hydroxy (Vit-D Deficiency, Fractures)   TSH   Other Visit Diagnoses     Need for HPV vaccination       Relevant Orders   HPV 9-valent vaccine,Recombinat (Completed)       Keep up a regular exercise program and make sure you are eating a healthy diet Try to eat 4 servings of dairy a day, or if you are lactose intolerant take a calcium with vitamin D daily.  Your vaccines are up to date. \  No follow-ups on file.     Nani Gasser, MD

## 2023-03-10 NOTE — Progress Notes (Signed)
Hi Timothy Shea,  LDL cholesterol is 160.  Triglycerides went up a little bit compared to last year.  Continue to work on healthy diet and regular exercise.  Your metabolic panel looks great.  Vitamin D is a little on the low end recommend 25 mcg daily.  Thyroid level looks good.  The ASCVD Risk score (Arnett DK, et al., 2019) failed to calculate for the following reasons:   The 2019 ASCVD risk score is only valid for ages 31 to 57

## 2023-03-12 ENCOUNTER — Ambulatory Visit: Payer: BC Managed Care – PPO | Admitting: Sports Medicine

## 2023-09-08 ENCOUNTER — Ambulatory Visit (INDEPENDENT_AMBULATORY_CARE_PROVIDER_SITE_OTHER): Payer: BC Managed Care – PPO | Admitting: Family Medicine

## 2023-09-08 VITALS — BP 116/74 | HR 57 | Ht 74.0 in | Wt 198.0 lb

## 2023-09-08 DIAGNOSIS — F5101 Primary insomnia: Secondary | ICD-10-CM | POA: Insufficient documentation

## 2023-09-08 DIAGNOSIS — Z23 Encounter for immunization: Secondary | ICD-10-CM | POA: Diagnosis not present

## 2023-09-08 DIAGNOSIS — I10 Essential (primary) hypertension: Secondary | ICD-10-CM

## 2023-09-08 DIAGNOSIS — E559 Vitamin D deficiency, unspecified: Secondary | ICD-10-CM | POA: Diagnosis not present

## 2023-09-08 NOTE — Patient Instructions (Signed)
Sleep you can do a trial of layering right capsules before bedtime or consider doing 1 to 2 tablets of Benadryl about 1 to 2 hours before bedtime.  If that is not helpful you can let me know and we can consider a trial of trazodone.

## 2023-09-08 NOTE — Assessment & Plan Note (Signed)
Well controlled. Continue current regimen. Follow up in  6 mo

## 2023-09-08 NOTE — Progress Notes (Signed)
Established Patient Office Visit  Subjective  Patient ID: Timothy Shea, male    DOB: [birth year]  Age: 28 y.o. MRN: [id number]  Chief Complaint  Patient presents with   Hypertension    HPI  Hypertension- Pt denies chest pain, SOB, dizziness, or heart palpitations.  Taking meds as directed w/o problems.  Denies medication side effects.    He also says that has not been sleeping well for years its mostly the falling asleep he does not feel particularly stressed or worried he does not necessarily feel like his mind races he just cannot fall asleep on average it takes him about 40 minutes to fall asleep sometimes much longer.  He does not drink caffeine at all he goes to bed at about the same time within a 30-minute window and gets up at the same time.  Vitamin D deficiency-he is still taking a supplement.    ROS    Objective:     BP 116/74   Pulse (!) 57   Ht 6\' 2"  (1.88 m)   Wt 198 lb (89.8 kg)   SpO2 100%   BMI 25.42 kg/m    Physical Exam Vitals and nursing note reviewed.  Constitutional:      Appearance: Normal appearance.  HENT:     Head: Normocephalic and atraumatic.  Eyes:     Conjunctiva/sclera: Conjunctivae normal.  Cardiovascular:     Rate and Rhythm: Normal rate and regular rhythm.  Pulmonary:     Effort: Pulmonary effort is normal.     Breath sounds: Normal breath sounds.  Skin:    General: Skin is warm and dry.  Neurological:     Mental Status: He is alert.  Psychiatric:        Mood and Affect: Mood normal.      No results found for any visits on 09/08/23.    The ASCVD Risk score (Arnett DK, et al., 2019) failed to calculate for the following reasons:   The 2019 ASCVD risk score is only valid for ages 76 to 53    Assessment & Plan:   Problem List Items Addressed This Visit       Cardiovascular and Mediastinum   Essential hypertension   Well controlled. Continue current regimen. Follow up in  6 mo       Relevant Orders   Flu  vaccine trivalent PF, 6mos and older(Flulaval,Afluria,Fluarix,Fluzone) (Completed)   CMP14+EGFR   VITAMIN D 25 Hydroxy (Vit-D Deficiency, Fractures)   CBC     Other   Vitamin D deficiency   Relevant Orders   CMP14+EGFR   VITAMIN D 25 Hydroxy (Vit-D Deficiency, Fractures)   CBC   Primary insomnia   Discussed lifestyle strategies around difficulty falling asleep.  Also discussed a recommendation for some over-the-counter products that might be helpful.  He has already tried melatonin and did not notice any improvement.  Also a ginger tea.  That he thought was helpful initially but is no longer helpful.  We also discussed medication prescription options if needed.      Relevant Orders   CMP14+EGFR   VITAMIN D 25 Hydroxy (Vit-D Deficiency, Fractures)   CBC   Other Visit Diagnoses       Encounter for immunization    -  Primary   Relevant Orders   Flu vaccine trivalent PF, 6mos and older(Flulaval,Afluria,Fluarix,Fluzone) (Completed)   CMP14+EGFR   VITAMIN D 25 Hydroxy (Vit-D Deficiency, Fractures)   CBC       Return in about 6 months (  around 03/14/2024).    Nani Gasser, MD

## 2023-09-08 NOTE — Assessment & Plan Note (Signed)
Discussed lifestyle strategies around difficulty falling asleep.  Also discussed a recommendation for some over-the-counter products that might be helpful.  He has already tried melatonin and did not notice any improvement.  Also a ginger tea.  That he thought was helpful initially but is no longer helpful.  We also discussed medication prescription options if needed.

## 2023-09-09 LAB — CMP14+EGFR
ALT: 20 [IU]/L (ref 0–44)
AST: 15 [IU]/L (ref 0–40)
Albumin: 4.8 g/dL (ref 4.3–5.2)
Alkaline Phosphatase: 87 [IU]/L (ref 44–121)
BUN/Creatinine Ratio: 11 (ref 9–20)
BUN: 10 mg/dL (ref 6–20)
Bilirubin Total: 0.6 mg/dL (ref 0.0–1.2)
CO2: 23 mmol/L (ref 20–29)
Calcium: 9.9 mg/dL (ref 8.7–10.2)
Chloride: 100 mmol/L (ref 96–106)
Creatinine, Ser: 0.92 mg/dL (ref 0.76–1.27)
Globulin, Total: 2.9 g/dL (ref 1.5–4.5)
Glucose: 90 mg/dL (ref 70–99)
Potassium: 4.1 mmol/L (ref 3.5–5.2)
Sodium: 139 mmol/L (ref 134–144)
Total Protein: 7.7 g/dL (ref 6.0–8.5)
eGFR: 116 mL/min/{1.73_m2} (ref >59)

## 2023-09-09 LAB — CBC
Hematocrit: 46.8 % (ref 37.5–51.0)
Hemoglobin: 16 g/dL (ref 13.0–17.7)
MCH: 29.6 pg (ref 26.6–33.0)
MCHC: 34.2 g/dL (ref 31.5–35.7)
MCV: 87 fL (ref 79–97)
Platelets: 306 10*3/uL (ref 150–450)
RBC: 5.41 x10E6/uL (ref 4.14–5.80)
RDW: 12.4 % (ref 11.6–15.4)
WBC: 7.3 10*3/uL (ref 3.4–10.8)

## 2023-09-09 LAB — VITAMIN D 25 HYDROXY (VIT D DEFICIENCY, FRACTURES): Vit D, 25-Hydroxy: 87 ng/mL (ref 30.0–100.0)

## 2023-09-09 NOTE — Progress Notes (Signed)
Call pt:Your lab work is within acceptable range and there are no concerning findings.

## 2024-01-20 ENCOUNTER — Ambulatory Visit (INDEPENDENT_AMBULATORY_CARE_PROVIDER_SITE_OTHER): Admitting: Family Medicine

## 2024-01-20 ENCOUNTER — Encounter: Payer: Self-pay | Admitting: Family Medicine

## 2024-01-20 VITALS — BP 134/88 | HR 58 | Ht 74.0 in | Wt 194.1 lb

## 2024-01-20 DIAGNOSIS — M79632 Pain in left forearm: Secondary | ICD-10-CM

## 2024-01-20 DIAGNOSIS — M79631 Pain in right forearm: Secondary | ICD-10-CM

## 2024-01-20 DIAGNOSIS — Z8639 Personal history of other endocrine, nutritional and metabolic disease: Secondary | ICD-10-CM

## 2024-01-20 DIAGNOSIS — M778 Other enthesopathies, not elsewhere classified: Secondary | ICD-10-CM

## 2024-01-20 MED ORDER — MELOXICAM 15 MG PO TABS: 15.0000 mg | ORAL_TABLET | Freq: Every day | ORAL | 1 refills | Status: DC

## 2024-01-20 NOTE — Progress Notes (Signed)
 Acute Office Visit  Subjective:     Patient ID: Timothy Shea, male    DOB: 1995-10-15, 28 y.o.   MRN: [id number]  Chief Complaint  Patient presents with   Hand Pain    HPI Patient is in today for pain in hands. Had a popping in his wrist while rock climbing a few weeks ago.  He did not have any immediate pain or discomfort but gradually after that started to notice pain particularly in the tendons in the back of his hand especially with repetitive activities such as typing.  Just brief activities did not seem to bother it and then if he kept typing or going then he would notice pain going up to his posterior forearm.  It felt similar to when he had extensor tendinitis about a year ago and was evaluated by Dr. Nolene Baumgarten with sports medicine at that time he was treated with topical NSAIDs, a sleeve, avoiding heavy lifting he was taken out of work for several weeks.  He says it did not really get better if anything it continued to get worse last year until he started taking a vitamin D  supplement and within about 3 days his pain went completely away.  He does still take vitamin D  now, a year later.  And in fact increased his vitamin D  when the pain started just to see if it would make a difference but it did not.  He is having discomfort predominantly in that posterior right hand and forearm but a little bit in the left.  Again more with repetitive activities..    ROS      Objective:    BP 134/88   Pulse (!) 58   Ht 6' 2 (1.88 m)   Wt 194 lb 1.9 oz (88.1 kg)   SpO2 100%   BMI 24.92 kg/m    Physical Exam Musculoskeletal:     Comments: Right wrist with normal appearance no induration or swelling or lesions.  Nontender over the wrist strength at the wrist and fingers is 5 out of 5.  Negative Finkelstein's test.  No significant pain or discomfort with dorsiflexion against resistance or flexion against resistance.     No results found for any visits on 01/20/24.      Assessment &  Plan:   Problem List Items Addressed This Visit       Musculoskeletal and Integument   Right wrist tendinitis   Relevant Medications   meloxicam (MOBIC) 15 MG tablet   Other Visit Diagnoses       Pain in both forearms    -  Primary   Relevant Orders   CBC with Differential/Platelet   TSH   B12   VITAMIN D  25 Hydroxy (Vit-D Deficiency, Fractures)   Vitamin B1     History of vitamin D  deficiency       Relevant Orders   CBC with Differential/Platelet   TSH   B12   VITAMIN D  25 Hydroxy (Vit-D Deficiency, Fractures)   Vitamin B1      Is consistent with extensor tendinitis-we will check some labs just to rule out some underlying deficiencies and will call with those results once available.  We did discuss doing an oral NSAID since topical NSAIDs did not seem to help much in the past.  We also discussed maybe doing a supportive brace instead of just a sleeve again since he did not feel like the sleeve was helpful in the past he actually says he does have a brace  for his right wrist at home he could also get 1 for his left he says he did start wearing it at night he could also wear it during the day as needed for more repetitive type activities.  I did encourage him to avoid heavy lifting I do think the tendon is most likely inflamed after his recent rockclimbing event and our goal is to reduce that inflammation to see if that provides pain relief.  If he is not improving over the next few weeks then we discussed getting him back in with a Ortho/sports med provider.  Meds ordered this encounter  Medications   meloxicam (MOBIC) 15 MG tablet    Sig: Take 1 tablet (15 mg total) by mouth daily.    Dispense:  30 tablet    Refill:  1    No follow-ups on file.  Duaine German, MD

## 2024-01-22 ENCOUNTER — Ambulatory Visit: Payer: Self-pay | Admitting: Family Medicine

## 2024-01-22 ENCOUNTER — Encounter: Payer: Self-pay | Admitting: Family Medicine

## 2024-01-22 NOTE — Progress Notes (Signed)
 Hi Timothy Shea, your blood count and thyroid  level look great.  B12 is normal.  Vitamin D  looks great.  B1 is still pending.  Call lab and see if we can add a B12 level.

## 2024-01-25 LAB — TSH: TSH: 2.81 u[IU]/mL (ref 0.450–4.500)

## 2024-01-25 LAB — CBC WITH DIFFERENTIAL/PLATELET
Basophils Absolute: 0.1 10*3/uL (ref 0.0–0.2)
Basos: 1 %
EOS (ABSOLUTE): 0.2 10*3/uL (ref 0.0–0.4)
Eos: 4 %
Hematocrit: 48.4 % (ref 37.5–51.0)
Hemoglobin: 16.1 g/dL (ref 13.0–17.7)
Immature Grans (Abs): 0 10*3/uL (ref 0.0–0.1)
Immature Granulocytes: 0 %
Lymphocytes Absolute: 2 10*3/uL (ref 0.7–3.1)
Lymphs: 34 %
MCH: 29.3 pg (ref 26.6–33.0)
MCHC: 33.3 g/dL (ref 31.5–35.7)
MCV: 88 fL (ref 79–97)
Monocytes Absolute: 0.4 10*3/uL (ref 0.1–0.9)
Monocytes: 7 %
Neutrophils Absolute: 3.1 10*3/uL (ref 1.4–7.0)
Neutrophils: 53 %
Platelets: 282 10*3/uL (ref 150–450)
RBC: 5.49 x10E6/uL (ref 4.14–5.80)
RDW: 12.8 % (ref 11.6–15.4)
WBC: 5.8 10*3/uL (ref 3.4–10.8)

## 2024-01-25 LAB — VITAMIN D 25 HYDROXY (VIT D DEFICIENCY, FRACTURES): Vit D, 25-Hydroxy: 96.5 ng/mL (ref 30.0–100.0)

## 2024-01-25 LAB — VITAMIN B12: Vitamin B-12: 740 pg/mL (ref 232–1245)

## 2024-01-25 LAB — VITAMIN B1: Thiamine: 98.9 nmol/L (ref 66.5–200.0)

## 2024-01-25 NOTE — Progress Notes (Signed)
 Vitamin B1 looks good.  So no deficiencies and no vitamin D  deficiency that should be causing your pain.  I still would recommend that we start with conservative treatment with your hand.  Would you like to see an orthopedist or sports med?

## 2024-03-17 ENCOUNTER — Encounter: Payer: Self-pay | Admitting: Family Medicine

## 2024-03-17 ENCOUNTER — Ambulatory Visit (INDEPENDENT_AMBULATORY_CARE_PROVIDER_SITE_OTHER): Admitting: Family Medicine

## 2024-03-17 VITALS — BP 127/76 | HR 57 | Ht 74.0 in | Wt 195.1 lb

## 2024-03-17 DIAGNOSIS — M778 Other enthesopathies, not elsewhere classified: Secondary | ICD-10-CM | POA: Diagnosis not present

## 2024-03-17 DIAGNOSIS — I10 Essential (primary) hypertension: Secondary | ICD-10-CM

## 2024-03-17 MED ORDER — LISINOPRIL 20 MG PO TABS: 20.0000 mg | ORAL_TABLET | Freq: Every day | ORAL | 3 refills | Status: AC

## 2024-03-17 NOTE — Assessment & Plan Note (Signed)
 Blood pressure looks great today.  Will make sure that they are adequate refills on file.  Labs are fairly recent.  Follow-up in 9 months.

## 2024-03-17 NOTE — Assessment & Plan Note (Signed)
 We discussed possibly following back up with sports med.  He felt like the PT that he did a year ago for his symptoms really did not help that much.  But I do think that is can to be the mainstay of treatment either home PT or formal PT.  He is welcome to follow-up here in our office with Dr. Debby Petties for further workup.  He did have an ultrasound last year showing some chronic changes of the tendon sheath showing some thickening and swelling particularly around tendons in CMPTs 2 and 4   There is great value in doing formal PT and building his strength back in between his episodic rockclimbing and biking to help really strengthen those areas.

## 2024-03-17 NOTE — Patient Instructions (Signed)
 Please feel free to make an appoint with Dr. Curtis here in our office for the tendinitis.  I do think you could be helpful for you.

## 2024-03-17 NOTE — Progress Notes (Signed)
 Established Patient Office Visit  Subjective  Patient ID: Timothy Shea, male    DOB: 01-04-1996  Age: 28 y.o. MRN: [id number]  Chief Complaint  Patient presents with   Hypertension    HPI  Hypertension- Pt denies chest pain, SOB, dizziness, or heart palpitations.  Taking meds as directed w/o problems.  Denies medication side effects.    Has concerns about the tendinitis in his hands it still most bothersome on the right but now is starting to get some issues on the left.  It predominantly affects the dorsal tendon attached to his middle finger sometimes when that starts to flare it then affects the index and fourth finger tendons sometimes it is bad enough it actually shoots up his forearm.  Again starting to affect the left side now.  Please see prior note.  We did do labs at last visit to rule out thyroid  disorder, vitamin D  deficiency and also checked for B12 and B1 deficiency.  Everything came back looking fantastic.    ROS    Objective:     BP 127/76   Pulse (!) 57   Ht 6' 2 (1.88 m)   Wt 195 lb 1.9 oz (88.5 kg)   SpO2 100%   BMI 25.05 kg/m    Physical Exam Vitals and nursing note reviewed.  Constitutional:      Appearance: Normal appearance.  HENT:     Head: Normocephalic and atraumatic.  Eyes:     Conjunctiva/sclera: Conjunctivae normal.  Cardiovascular:     Rate and Rhythm: Normal rate and regular rhythm.  Pulmonary:     Effort: Pulmonary effort is normal.     Breath sounds: Normal breath sounds.  Skin:    General: Skin is warm and dry.  Neurological:     Mental Status: He is alert.  Psychiatric:        Mood and Affect: Mood normal.      No results found for any visits on 03/17/24.    The ASCVD Risk score (Arnett DK, et al., 2019) failed to calculate for the following reasons:   The 2019 ASCVD risk score is only valid for ages 33 to 70    Assessment & Plan:   Problem List Items Addressed This Visit       Cardiovascular and  Mediastinum   Essential hypertension - Primary   Blood pressure looks great today.  Will make sure that they are adequate refills on file.  Labs are fairly recent.  Follow-up in 9 months.      Relevant Medications   lisinopril  (ZESTRIL ) 20 MG tablet     Musculoskeletal and Integument   Right wrist tendinitis   We discussed possibly following back up with sports med.  He felt like the PT that he did a year ago for his symptoms really did not help that much.  But I do think that is can to be the mainstay of treatment either home PT or formal PT.  He is welcome to follow-up here in our office with Dr. Debby Petties for further workup.  He did have an ultrasound last year showing some chronic changes of the tendon sheath showing some thickening and swelling particularly around tendons in CMPTs 2 and 4   There is great value in doing formal PT and building his strength back in between his episodic rockclimbing and biking to help really strengthen those areas.       Return in about 9 months (around 12/15/2024) for Hypertension.  Dorothyann Byars, MD

## 2024-03-18 ENCOUNTER — Ambulatory Visit: Payer: BC Managed Care – PPO | Admitting: Family Medicine

## 2024-12-22 ENCOUNTER — Ambulatory Visit: Admitting: Family Medicine
